# Patient Record
Sex: Female | Born: 1955 | Race: Black or African American | Hispanic: No | Marital: Single | Smoking: Never smoker
Health system: Southern US, Community
[De-identification: ages and names within clinical notes are randomized; demographics above are authoritative.]

## PROBLEM LIST (undated history)

## (undated) DIAGNOSIS — E119 Type 2 diabetes mellitus without complications: Secondary | ICD-10-CM

## (undated) DIAGNOSIS — M5430 Sciatica, unspecified side: Secondary | ICD-10-CM

## (undated) DIAGNOSIS — G4733 Obstructive sleep apnea (adult) (pediatric): Secondary | ICD-10-CM

## (undated) DIAGNOSIS — I10 Essential (primary) hypertension: Secondary | ICD-10-CM

## (undated) HISTORY — PX: COLPOSCOPY: SHX161

## (undated) HISTORY — PX: TONSILLECTOMY: SUR1361

## (undated) HISTORY — PX: LUMBAR LAMINECTOMY: SHX95

## (undated) HISTORY — PX: CHOLECYSTECTOMY: SHX55

## (undated) HISTORY — DX: Type 2 diabetes mellitus without complications: E11.9

## (undated) HISTORY — DX: Obstructive sleep apnea (adult) (pediatric): G47.33

## (undated) HISTORY — DX: Essential (primary) hypertension: I10

---

## 2012-05-09 ENCOUNTER — Encounter: Payer: Self-pay | Admitting: Women's Health

## 2012-05-09 ENCOUNTER — Ambulatory Visit (INDEPENDENT_AMBULATORY_CARE_PROVIDER_SITE_OTHER): Payer: Commercial Managed Care - PPO | Admitting: Women's Health

## 2012-05-09 VITALS — BP 144/92 | Ht 65.0 in | Wt 232.0 lb

## 2012-05-09 DIAGNOSIS — I10 Essential (primary) hypertension: Secondary | ICD-10-CM

## 2012-05-09 DIAGNOSIS — Z8632 Personal history of gestational diabetes: Secondary | ICD-10-CM

## 2012-05-09 DIAGNOSIS — Z780 Asymptomatic menopausal state: Secondary | ICD-10-CM

## 2012-05-09 DIAGNOSIS — Z01419 Encounter for gynecological examination (general) (routine) without abnormal findings: Secondary | ICD-10-CM

## 2012-05-09 HISTORY — DX: Personal history of gestational diabetes: Z86.32

## 2012-05-09 LAB — WET PREP FOR TRICH, YEAST, CLUE: Clue Cells Wet Prep HPF POC: NONE SEEN

## 2012-05-09 NOTE — Patient Instructions (Signed)

## 2012-05-09 NOTE — Progress Notes (Signed)
Cohen Doleman 1955-10-22 [id number]    History:    New patient presents for annual exam.  Postmenopausal with no bleeding on no HRT. Not sexually active for greater than 5 years. Hypertensive, primary care manages meds and labs. Normal colonoscopy at age 56. History of normal mammograms. History of ascus with negative colposcopy in 98, normal Paps after.  Past medical history, past surgical history, family history and social history were all reviewed and documented in the EPIC chart. 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 Four sons, attorney, M.D., CPA and a 73 year old. Originally from [place].   ROS:  A  ROS was performed and pertinent positives and negatives are included in the history.  Exam:  Filed Vitals:   05/09/12 1056  BP: 144/92    General appearance:  Normal Head/Neck:  Normal, without cervical or supraclavicular adenopathy. Thyroid:  Symmetrical, normal in size, without palpable masses or nodularity. Respiratory  Effort:  Normal  Auscultation:  Clear without wheezing or rhonchi Cardiovascular  Auscultation:  Regular rate, without rubs, murmurs or gallops  Edema/varicosities:  Not grossly evident Abdominal  Soft,nontender, without masses, guarding or rebound.  Liver/spleen:  No organomegaly noted  Hernia:  None appreciated  Skin  Inspection:  Grossly normal  Palpation:  Grossly normal Neurologic/psychiatric  Orientation:  Normal with appropriate conversation.  Mood/affect:  Normal  Genitourinary    Breasts: Examined lying and sitting.     Right: Without masses, retractions, discharge or axillary adenopathy.     Left: Without masses, retractions, discharge or axillary adenopathy.   Inguinal/mons:  Normal without inguinal adenopathy  External genitalia:  Normal minimal erythema external vaginal introitus  BUS/Urethra/Skene's glands:  Normal  Bladder:  Normal  Vagina:  Normal  Cervix:  Normal  Uterus:    normal in size, shape and contour.  Midline and mobile  Adnexa/parametria:     Rt: Without masses or tenderness.   Lt: Without masses or tenderness.  Anus and perineum: Normal  Digital rectal exam: Normal sphincter tone without palpated masses or tenderness  Assessment/Plan:  56 y.o. DBF G8P4 for annual exam with complaint of external vaginal itching.  Normal postmenopausal exam Overweight Hypertensive-primary care   Plan: DEXA, will schedule. Reviewed importance of vitamin D 2000 daily, increasing regular exercise, decreasing calories for weight loss and calcium rich diet encouraged. SBE's, continue annual mammogram. Wet prep negative, Terazol 7 cream externally as needed. Instructed to call if no relief of symptoms. UA, home Hemoccult card given with instructions, Pap normal 2012 per patient. Reviewed new screening guidelines. Instructed to have records sent.    Harrington Challenger WHNP, 1:35 PM 05/09/2012

## 2012-09-08 ENCOUNTER — Other Ambulatory Visit: Payer: Self-pay | Admitting: Gynecology

## 2012-09-08 DIAGNOSIS — Z780 Asymptomatic menopausal state: Secondary | ICD-10-CM

## 2013-06-06 DIAGNOSIS — I10 Essential (primary) hypertension: Secondary | ICD-10-CM | POA: Insufficient documentation

## 2013-06-06 DIAGNOSIS — N644 Mastodynia: Secondary | ICD-10-CM

## 2013-06-06 HISTORY — DX: Mastodynia: N64.4

## 2013-07-18 DIAGNOSIS — E7800 Pure hypercholesterolemia, unspecified: Secondary | ICD-10-CM

## 2013-07-18 DIAGNOSIS — G4700 Insomnia, unspecified: Secondary | ICD-10-CM | POA: Insufficient documentation

## 2013-07-18 DIAGNOSIS — R799 Abnormal finding of blood chemistry, unspecified: Secondary | ICD-10-CM | POA: Insufficient documentation

## 2013-07-18 HISTORY — DX: Abnormal finding of blood chemistry, unspecified: R79.9

## 2013-07-18 HISTORY — DX: Insomnia, unspecified: G47.00

## 2013-07-18 HISTORY — DX: Pure hypercholesterolemia, unspecified: E78.00

## 2013-12-22 DIAGNOSIS — R011 Cardiac murmur, unspecified: Secondary | ICD-10-CM

## 2013-12-22 HISTORY — DX: Cardiac murmur, unspecified: R01.1

## 2014-02-06 DIAGNOSIS — I517 Cardiomegaly: Secondary | ICD-10-CM | POA: Insufficient documentation

## 2014-02-06 DIAGNOSIS — I371 Nonrheumatic pulmonary valve insufficiency: Secondary | ICD-10-CM | POA: Insufficient documentation

## 2014-02-28 DIAGNOSIS — E876 Hypokalemia: Secondary | ICD-10-CM

## 2014-02-28 HISTORY — DX: Hypokalemia: E87.6

## 2014-04-09 ENCOUNTER — Encounter: Payer: Self-pay | Admitting: Women's Health

## 2014-07-10 DIAGNOSIS — K5909 Other constipation: Secondary | ICD-10-CM | POA: Insufficient documentation

## 2014-10-12 ENCOUNTER — Encounter (HOSPITAL_BASED_OUTPATIENT_CLINIC_OR_DEPARTMENT_OTHER): Payer: Commercial Managed Care - PPO

## 2014-12-30 ENCOUNTER — Ambulatory Visit (HOSPITAL_BASED_OUTPATIENT_CLINIC_OR_DEPARTMENT_OTHER): Payer: 59 | Attending: Internal Medicine

## 2015-01-14 DIAGNOSIS — Z1211 Encounter for screening for malignant neoplasm of colon: Secondary | ICD-10-CM | POA: Insufficient documentation

## 2015-01-14 DIAGNOSIS — N951 Menopausal and female climacteric states: Secondary | ICD-10-CM | POA: Insufficient documentation

## 2015-01-14 DIAGNOSIS — N6081 Other benign mammary dysplasias of right breast: Secondary | ICD-10-CM

## 2015-01-14 DIAGNOSIS — R102 Pelvic and perineal pain: Secondary | ICD-10-CM

## 2015-01-14 HISTORY — DX: Pelvic and perineal pain: R10.2

## 2015-01-14 HISTORY — DX: Encounter for screening for malignant neoplasm of colon: Z12.11

## 2015-01-14 HISTORY — DX: Other benign mammary dysplasias of right breast: N60.81

## 2015-06-04 DIAGNOSIS — J301 Allergic rhinitis due to pollen: Secondary | ICD-10-CM | POA: Insufficient documentation

## 2015-06-04 DIAGNOSIS — R09A2 Foreign body sensation, throat: Secondary | ICD-10-CM

## 2015-06-04 DIAGNOSIS — R110 Nausea: Secondary | ICD-10-CM

## 2015-06-04 DIAGNOSIS — J302 Other seasonal allergic rhinitis: Secondary | ICD-10-CM | POA: Insufficient documentation

## 2015-06-04 HISTORY — DX: Other seasonal allergic rhinitis: J30.2

## 2015-06-04 HISTORY — DX: Nausea: R11.0

## 2015-06-04 HISTORY — DX: Foreign body sensation, throat: R09.A2

## 2015-06-12 ENCOUNTER — Other Ambulatory Visit (HOSPITAL_COMMUNITY): Payer: Self-pay | Admitting: Internal Medicine

## 2015-06-12 DIAGNOSIS — I159 Secondary hypertension, unspecified: Secondary | ICD-10-CM

## 2015-06-13 ENCOUNTER — Ambulatory Visit (HOSPITAL_COMMUNITY)
Admission: RE | Admit: 2015-06-13 | Discharge: 2015-06-13 | Disposition: A | Discharge status: Home / Self Care | Payer: 59 | Source: Ambulatory Visit | Attending: Internal Medicine | Admitting: Internal Medicine

## 2015-06-13 DIAGNOSIS — I159 Secondary hypertension, unspecified: Secondary | ICD-10-CM | POA: Diagnosis not present

## 2015-06-13 NOTE — Progress Notes (Signed)
*  PRELIMINARY RESULTS* Vascular Ultrasound Renal artery duplex has been completed.  Preliminary findings: No evidence of renal artery stenosis.   Farrel DemarkJill Eunice, RDMS, RVT  06/13/2015, 3:05 PM

## 2015-06-24 MED FILL — AMLODIPINE BESYLATE 10 MG T: 10 | 90 days supply | Qty: 90 | Fill #1

## 2015-06-27 MED FILL — LOSARTAN-HCTZ 100-25 MG TAB: 100-25 | 90 days supply | Qty: 90 | Fill #1

## 2015-07-09 MED FILL — PANTOPRAZOLE SOD DR 40 MG T: 40 | 90 days supply | Qty: 90 | Fill #0

## 2015-07-12 DIAGNOSIS — I10 Essential (primary) hypertension: Secondary | ICD-10-CM | POA: Diagnosis not present

## 2015-07-12 DIAGNOSIS — I371 Nonrheumatic pulmonary valve insufficiency: Secondary | ICD-10-CM | POA: Diagnosis not present

## 2015-07-12 DIAGNOSIS — I517 Cardiomegaly: Secondary | ICD-10-CM | POA: Diagnosis not present

## 2015-07-12 DIAGNOSIS — E039 Hypothyroidism, unspecified: Secondary | ICD-10-CM | POA: Insufficient documentation

## 2015-07-12 MED FILL — cloNIDine HCL 0.1 MG TABS: 0.1 | 30 days supply | Qty: 60 | Fill #0

## 2015-07-12 MED FILL — METOPROLOL TARTRATE 100 MG: 100 | 90 days supply | Qty: 180 | Fill #0

## 2015-07-19 DIAGNOSIS — Z139 Encounter for screening, unspecified: Secondary | ICD-10-CM | POA: Diagnosis not present

## 2015-07-19 DIAGNOSIS — Z1231 Encounter for screening mammogram for malignant neoplasm of breast: Secondary | ICD-10-CM | POA: Diagnosis not present

## 2015-07-25 MED FILL — POTASSIUM CL ER 20 MEQ TABL: 20 | 30 days supply | Qty: 120 | Fill #0 | Status: TO

## 2015-07-31 DIAGNOSIS — R509 Fever, unspecified: Secondary | ICD-10-CM | POA: Diagnosis not present

## 2015-07-31 DIAGNOSIS — R6889 Other general symptoms and signs: Secondary | ICD-10-CM | POA: Diagnosis not present

## 2015-07-31 MED FILL — ONDANSETRON ODT 4 MG TABLET: 4 | 7 days supply | Qty: 20 | Fill #0

## 2015-07-31 MED FILL — OSELTAMIVIR PHOS 75 MG CAP: 75 | 5 days supply | Qty: 10 | Fill #0

## 2015-08-03 ENCOUNTER — Emergency Department (HOSPITAL_BASED_OUTPATIENT_CLINIC_OR_DEPARTMENT_OTHER)
Admission: EM | Admit: 2015-08-03 | Discharge: 2015-08-03 | Disposition: A | Discharge status: Home / Self Care | Payer: 59 | Attending: Emergency Medicine | Admitting: Emergency Medicine

## 2015-08-03 ENCOUNTER — Emergency Department (HOSPITAL_BASED_OUTPATIENT_CLINIC_OR_DEPARTMENT_OTHER): Payer: 59

## 2015-08-03 ENCOUNTER — Encounter (HOSPITAL_BASED_OUTPATIENT_CLINIC_OR_DEPARTMENT_OTHER): Payer: Self-pay

## 2015-08-03 DIAGNOSIS — J189 Pneumonia, unspecified organism: Secondary | ICD-10-CM | POA: Diagnosis not present

## 2015-08-03 DIAGNOSIS — B349 Viral infection, unspecified: Secondary | ICD-10-CM | POA: Insufficient documentation

## 2015-08-03 DIAGNOSIS — Z79899 Other long term (current) drug therapy: Secondary | ICD-10-CM | POA: Diagnosis not present

## 2015-08-03 DIAGNOSIS — Z7982 Long term (current) use of aspirin: Secondary | ICD-10-CM | POA: Diagnosis not present

## 2015-08-03 DIAGNOSIS — J159 Unspecified bacterial pneumonia: Secondary | ICD-10-CM | POA: Insufficient documentation

## 2015-08-03 DIAGNOSIS — Z8632 Personal history of gestational diabetes: Secondary | ICD-10-CM | POA: Diagnosis not present

## 2015-08-03 DIAGNOSIS — R112 Nausea with vomiting, unspecified: Secondary | ICD-10-CM | POA: Diagnosis present

## 2015-08-03 DIAGNOSIS — R509 Fever, unspecified: Secondary | ICD-10-CM | POA: Diagnosis not present

## 2015-08-03 LAB — CBC WITH DIFFERENTIAL/PLATELET
BASOS ABS: 0 10*3/uL (ref 0.0–0.1)
Basophils Relative: 0 %
EOS PCT: 0 %
Eosinophils Absolute: 0 10*3/uL (ref 0.0–0.7)
HEMATOCRIT: 38.7 % (ref 36.0–46.0)
Hemoglobin: 13.4 g/dL (ref 12.0–15.0)
LYMPHS ABS: 1.4 10*3/uL (ref 0.7–4.0)
LYMPHS PCT: 33 %
MCH: 31.8 pg (ref 26.0–34.0)
MCHC: 34.6 g/dL (ref 30.0–36.0)
MCV: 91.9 fL (ref 78.0–100.0)
MONO ABS: 0.3 10*3/uL (ref 0.1–1.0)
Monocytes Relative: 7 %
NEUTROS ABS: 2.6 10*3/uL (ref 1.7–7.7)
Neutrophils Relative %: 60 %
PLATELETS: 145 10*3/uL — AB (ref 150–400)
RBC: 4.21 MIL/uL (ref 3.87–5.11)
RDW: 12.9 % (ref 11.5–15.5)
WBC: 4.3 10*3/uL (ref 4.0–10.5)

## 2015-08-03 LAB — URINALYSIS, ROUTINE W REFLEX MICROSCOPIC
Bilirubin Urine: NEGATIVE
GLUCOSE, UA: NEGATIVE mg/dL
HGB URINE DIPSTICK: NEGATIVE
Ketones, ur: NEGATIVE mg/dL
LEUKOCYTES UA: NEGATIVE
Nitrite: NEGATIVE
PH: 6.5 (ref 5.0–8.0)
Protein, ur: NEGATIVE mg/dL
Specific Gravity, Urine: 1.006 (ref 1.005–1.030)

## 2015-08-03 LAB — COMPREHENSIVE METABOLIC PANEL
ALBUMIN: 3.9 g/dL (ref 3.5–5.0)
ALT: 28 U/L (ref 14–54)
ANION GAP: 11 (ref 5–15)
AST: 44 U/L — AB (ref 15–41)
Alkaline Phosphatase: 44 U/L (ref 38–126)
BUN: 10 mg/dL (ref 6–20)
CHLORIDE: 98 mmol/L — AB (ref 101–111)
CO2: 29 mmol/L (ref 22–32)
Calcium: 8.8 mg/dL — ABNORMAL LOW (ref 8.9–10.3)
Creatinine, Ser: 0.91 mg/dL (ref 0.44–1.00)
GFR calc Af Amer: >60 mL/min (ref >60)
GFR calc non Af Amer: >60 mL/min (ref >60)
GLUCOSE: 149 mg/dL — AB (ref 65–99)
POTASSIUM: 3.3 mmol/L — AB (ref 3.5–5.1)
Sodium: 138 mmol/L (ref 135–145)
TOTAL PROTEIN: 7.9 g/dL (ref 6.5–8.1)
Total Bilirubin: 1 mg/dL (ref 0.3–1.2)

## 2015-08-03 MED ORDER — SODIUM CHLORIDE 0.9 % IV BOLUS (SEPSIS)
1000.0000 mL | Freq: Once | INTRAVENOUS | Status: AC
  Administered 2015-08-03: 1000 mL via INTRAVENOUS

## 2015-08-03 MED ORDER — PROMETHAZINE HCL 25 MG PO TABS
25.0000 mg | ORAL_TABLET | Freq: Four times a day (QID) | ORAL | Status: DC | PRN
Start: 2015-08-03 — End: 2021-10-10

## 2015-08-03 MED ORDER — ONDANSETRON HCL 4 MG/2ML IJ SOLN
4.0000 mg | Freq: Once | INTRAMUSCULAR | Status: AC
  Administered 2015-08-03: 4 mg via INTRAVENOUS
  Filled 2015-08-03: qty 2

## 2015-08-03 MED ORDER — AZITHROMYCIN 250 MG PO TABS: 250.0000 mg | ORAL_TABLET | Freq: Every day | ORAL | Status: DC

## 2015-08-03 NOTE — ED Notes (Signed)
MD at bedside discussing results with patient 

## 2015-08-03 NOTE — ED Notes (Signed)
Pt attempting to obtain urine at this time  

## 2015-08-03 NOTE — ED Notes (Signed)
Pt changing into gown at this time

## 2015-08-03 NOTE — ED Notes (Signed)
Pt reports nausea and fever since Sunday. Reports going to [facility], treated with tamiflu and zofran. Reports no relief with zofran. Denies any pain. Reports generalized fatigue.

## 2015-08-03 NOTE — Discharge Instructions (Signed)

## 2015-08-03 NOTE — ED Notes (Signed)
Pt transported to XR at this time.

## 2015-08-03 NOTE — ED Provider Notes (Signed)
CSN: [id number]     Arrival date & time 08/03/15  4098 History   First MD Initiated Contact with Patient 08/03/15 [phone]     Chief Complaint  Patient presents with  . Emesis     (Consider location/radiation/quality/duration/timing/severity/associated sxs/prior Treatment) HPI Comments: Patient presents with nausea and vomiting. She started feeling sick about 6 days ago. She noted some nasal congestion and postnasal drip. The next day she was feeling achy and feverish. Her temperature gradually rose 202. She was having myalgias and a productive cough. She was seen at an urgent care center. This was 4 days ago. She had a negative rapid influenza test but was still diagnosed with probable influenza. She's had multiple coworkers.has had the flu. She was started on Tamiflu and Zofran. She had some mild nausea prior to this visit. She states that since then she's had worsening nausea and several episodes of vomiting. She's having normal bowel movements. She states her fevers have improved. Her myalgias and congestion have improved although she still has a productive cough.  She denies any urinary symptoms. She denies abdominal pain.  No CP or SOB.  She is postmenopausal.  Patient is a 60 y.o. female presenting with vomiting.  Emesis Associated symptoms: myalgias   Associated symptoms: no abdominal pain, no arthralgias, no chills, no diarrhea and no headaches     Past Medical History  Diagnosis Date  . Diabetes mellitus without complication [facility])     gestational   Past Surgical History  Procedure Laterality Date  . Colposcopy    . Cesarean section      x3  . Cholecystectomy    . Tonsillectomy     Family History  Problem Relation Age of Onset  . Hypertension Mother   . Stroke Mother   . Thyroid disease Mother    Social History  Substance Use Topics  . Smoking status: Never Smoker   . Smokeless tobacco: None  . Alcohol Use: No   OB History    Gravida Para Term Preterm AB TAB SAB  Ectopic Multiple Living   Review of Systems  Constitutional: Positive for fatigue. Negative for fever, chills and diaphoresis.  HENT: Positive for congestion, postnasal drip and rhinorrhea. Negative for sneezing.   Eyes: Negative.   Respiratory: Positive for cough. Negative for chest tightness and shortness of breath.   Cardiovascular: Negative for chest pain and leg swelling.  Gastrointestinal: Positive for nausea and vomiting. Negative for abdominal pain, diarrhea and blood in stool.  Genitourinary: Negative for frequency, hematuria, flank pain and difficulty urinating.  Musculoskeletal: Positive for myalgias. Negative for back pain and arthralgias.  Skin: Negative for rash.  Neurological: Negative for dizziness, speech difficulty, weakness, numbness and headaches.      Allergies  Review of patient's allergies indicates no known allergies.  Home Medications   Prior to Admission medications   Medication Sig Start Date End Date Taking? Authorizing Provider  aspirin 81 MG tablet Take 81 mg by mouth daily.    Historical Provider, MD  azithromycin (ZITHROMAX) 250 MG tablet Take 1 tablet (250 mg total) by mouth daily. Take first 2 tablets together, then 1 every day until finished. 08/03/15   Rolan Bucco, MD  metoprolol succinate (TOPROL-XL) 100 MG 24 hr tablet Take 100 mg by mouth daily. Take with or immediately following a meal.    Historical Provider, MD  Multiple Vitamin (MULTIVITAMIN) capsule Take 1 capsule by mouth  daily.    Historical Provider, MD  Olmesartan-Amlodipine-HCTZ (TRIBENZOR) 40-10-25 MG TABS Take by mouth.    Historical Provider, MD  pantoprazole (PROTONIX) 40 MG tablet Take 40 mg by mouth daily.    Historical Provider, MD  promethazine (PHENERGAN) 25 MG tablet Take 1 tablet (25 mg total) by mouth every 6 (six) hours as needed for nausea or vomiting. 08/03/15   Rolan Bucco, MD   BP 142/73 mmHg  Pulse 62  Temp(Src) 99.4 F (37.4 C) (Oral)   Resp 20  Ht  (1.626 m)  Wt 224 lb (101.606 kg)  BMI 38.43 kg/m2  SpO2 95% Physical Exam  Constitutional: She is oriented to person, place, and time. She appears well-developed and well-nourished.  HENT:  Head: Normocephalic and atraumatic.  Right Ear: External ear normal.  Left Ear: External ear normal.  Mouth/Throat: Oropharynx is clear and moist.  Eyes: Pupils are equal, round, and reactive to light.  Neck: Normal range of motion. Neck supple.  Cardiovascular: Normal rate, regular rhythm and normal heart sounds.   Pulmonary/Chest: Effort normal and breath sounds normal. No respiratory distress. She has no wheezes. She has no rales. She exhibits no tenderness.  Abdominal: Soft. Bowel sounds are normal. There is no tenderness. There is no rebound and no guarding.  Musculoskeletal: Normal range of motion. She exhibits no edema.  Lymphadenopathy:    She has no cervical adenopathy.  Neurological: She is alert and oriented to person, place, and time.  Skin: Skin is warm and dry. No rash noted.  Psychiatric: She has a normal mood and affect.    ED Course  Procedures (including critical care time) Labs Review Labs Reviewed  COMPREHENSIVE METABOLIC PANEL - Abnormal; Notable for the following:    Potassium 3.3 (*)    Chloride 98 (*)    Glucose, Bld 149 (*)    Calcium 8.8 (*)    AST 44 (*)    All other components within normal limits  CBC WITH DIFFERENTIAL/PLATELET - Abnormal; Notable for the following:    Platelets 145 (*)    All other components within normal limits  URINALYSIS, ROUTINE W REFLEX MICROSCOPIC (NOT AT [facility])    Imaging Review Dg Chest 2 View  08/03/2015  CLINICAL DATA:  Shortness breath and fever for 2 days. EXAM: CHEST  2 VIEW COMPARISON:  None. FINDINGS: The cardiomediastinal silhouette is unremarkable. Ill-defined patchy nodular opacities within the right mid lung and left mid-lower lung are identified and suspect pneumonia. There is no evidence of pleural  effusion, pneumothorax or pulmonary edema. Mild peribronchial thickening is identified. No acute bony abnormalities are noted. IMPRESSION: Ill-defined patchy nodular opacities within the right mid and left mid -lower lungs - suspect pneumonia. Radiographic follow-up to resolution. Electronically Signed   By: Harmon Pier M.D.   On: 08/03/2015 09:11   I have personally reviewed and evaluated these images and lab results as part of my medical decision-making.   EKG Interpretation None      MDM   Final diagnoses:  Viral syndrome  CAP (community acquired pneumonia)    Patient presents with a viral-like symptoms, likely consistent with influenza. She has ongoing nausea and vomiting which is likely partly related to the influenza and could be made worse by the Tamiflu. At this point, I advised her to stop the Tamiflu. She was hydrated with normal saline. She was given IV Zofran and is feeling much better after this. She's tolerating by mouth fluids. Her chest x-ray does show probably pneumonia.  I will start her on Zithromax. I feel that outpatient treatment is appropriate. Her vital signs are stable. There is no hypoxia. She has no shortness of breath. She was given a prescription for Phenergan to use as needed for ongoing nausea. I encouraged her to follow-up with her PCP in 2 days if her symptoms are not improving or return here as needed for any worsening symptoms. I also advised her that her blood glucose was slightly elevated and this needs to be followed up on by her PCP.    Rolan Bucco, MD 08/03/15 [phone]

## 2015-08-03 NOTE — ED Notes (Signed)
MD at bedside. 

## 2015-08-03 NOTE — ED Notes (Signed)
Pt given PO fluids at this time 

## 2015-08-06 MED FILL — AMOX-CLAV 875-125 MG TABLET: 875-125 | 10 days supply | Qty: 20 | Fill #0

## 2015-08-09 DIAGNOSIS — I10 Essential (primary) hypertension: Secondary | ICD-10-CM | POA: Diagnosis not present

## 2015-08-09 MED FILL — cloNIDine HCL 0.1 MG TABS: 0.1 | 45 days supply | Qty: 180 | Fill #0 | Status: TO

## 2015-09-04 MED FILL — ZOLPIDEM TARTRATE 10 MG TAB: 10 | 90 days supply | Qty: 90 | Fill #1

## 2015-09-06 DIAGNOSIS — Z01411 Encounter for gynecological examination (general) (routine) with abnormal findings: Secondary | ICD-10-CM | POA: Diagnosis not present

## 2015-09-06 DIAGNOSIS — N952 Postmenopausal atrophic vaginitis: Secondary | ICD-10-CM | POA: Diagnosis not present

## 2015-09-06 DIAGNOSIS — Z01419 Encounter for gynecological examination (general) (routine) without abnormal findings: Secondary | ICD-10-CM | POA: Diagnosis not present

## 2015-09-06 DIAGNOSIS — N762 Acute vulvitis: Secondary | ICD-10-CM | POA: Diagnosis not present

## 2015-09-06 DIAGNOSIS — Z6836 Body mass index (BMI) 36.0-36.9, adult: Secondary | ICD-10-CM | POA: Diagnosis not present

## 2015-09-06 MED FILL — FLUCONAZOLE 150 MG TABLET: 150 | 6 days supply | Qty: 3 | Fill #0 | Status: TO

## 2015-09-06 MED FILL — AMLODIPINE BESYLATE 10 MG T: 10 | 90 days supply | Qty: 90 | Fill #0 | Status: TO

## 2015-09-11 DIAGNOSIS — I10 Essential (primary) hypertension: Secondary | ICD-10-CM | POA: Diagnosis not present

## 2015-09-16 DIAGNOSIS — R0683 Snoring: Secondary | ICD-10-CM | POA: Diagnosis not present

## 2015-09-16 DIAGNOSIS — I10 Essential (primary) hypertension: Secondary | ICD-10-CM | POA: Diagnosis not present

## 2015-09-25 MED FILL — KLOR-CON M20 TABLET: 20 | 30 days supply | Qty: 120 | Fill #0 | Status: TO

## 2015-09-25 MED FILL — cloNIDine HCL 0.1 MG TABS: 0.1 | 45 days supply | Qty: 180 | Fill #0

## 2015-10-09 ENCOUNTER — Ambulatory Visit (HOSPITAL_BASED_OUTPATIENT_CLINIC_OR_DEPARTMENT_OTHER): Payer: 59 | Attending: Internal Medicine | Admitting: Internal Medicine

## 2015-10-09 VITALS — Ht 65.0 in | Wt 230.0 lb

## 2015-10-09 DIAGNOSIS — G4736 Sleep related hypoventilation in conditions classified elsewhere: Secondary | ICD-10-CM | POA: Insufficient documentation

## 2015-10-09 DIAGNOSIS — G4733 Obstructive sleep apnea (adult) (pediatric): Secondary | ICD-10-CM | POA: Insufficient documentation

## 2015-10-09 DIAGNOSIS — R0683 Snoring: Secondary | ICD-10-CM | POA: Insufficient documentation

## 2015-10-09 MED FILL — PANTOPRAZOLE SOD DR 40 MG T: 40 | 90 days supply | Qty: 90 | Fill #1

## 2015-10-15 MED FILL — LOSARTAN-HCTZ 100-25 MG TAB: 100-25 | 90 days supply | Qty: 90 | Fill #0

## 2015-10-19 DIAGNOSIS — G4733 Obstructive sleep apnea (adult) (pediatric): Secondary | ICD-10-CM

## 2015-10-19 DIAGNOSIS — R0683 Snoring: Secondary | ICD-10-CM | POA: Diagnosis not present

## 2015-10-19 NOTE — Procedures (Signed)
        Patient Name: Janet Phelps, Janet Phelps Study Date: 10/09/2015 Gender: Female D.O.B: 08/28/1955 Age (years): 59 Referring Provider: Agustina CaroliKristin D Hicks Height (inches): 65 Interpreting Physician: Jetty Duhamellinton Young MD, ABSM Weight (lbs): 223 RPSGT: [place], [place] BMI: 37 MRN: [id number] Neck Size: 14.00 CLINICAL INFORMATION Sleep Study Type: HST Indication for sleep study: N/A Epworth Sleepiness Score: 2  SLEEP STUDY TECHNIQUE A multi-channel overnight portable sleep study was performed. The channels recorded were: nasal airflow, thoracic respiratory movement, and oxygen saturation with a pulse oximetry. Snoring was also monitored.  MEDICATIONS Patient self administered medications include: none reported during sleep study  SLEEP ARCHITECTURE Patient was studied for 460.1 minutes. The sleep efficiency was 97.1 % and the patient was supine for 50.9%. The arousal index was 0.0 per hour.  RESPIRATORY PARAMETERS The overall AHI was 12.5 per hour, with a central apnea index of 0.0 per hour. The oxygen nadir was 80% during sleep.  CARDIAC DATA Mean heart rate during sleep was 52.1 bpm.  IMPRESSIONS - Mild obstructive sleep apnea occurred during this study (AHI = 12.5/h). - No significant central sleep apnea occurred during this study (CAI = 0.0/h). - Severe oxygen desaturation was noted during this study (Min O2 = 80%). - Patient snored .  DIAGNOSIS - Obstructive Sleep Apnea (327.23 [G47.33 ICD-10]) - Nocturnal Hypoxemia (327.26 [G47.36 ICD-10])  RECOMMENDATIONS - Therapeutic CPAP titration to determine optimal pressure required to alleviate sleep disordered breathing. - Positional therapy avoiding supine position during sleep. - Surgical consultation for Uvulopalatopharyngoplasty (UPPP) may be considered. - Oral appliance may be considered. - Avoid alcohol, sedatives and other CNS depressants that may worsen sleep apnea and disrupt normal sleep architecture. - Sleep  hygiene should be reviewed to assess factors that may improve sleep quality. - Weight management and regular exercise should be initiated or continued.  Waymon BudgeYOUNG,CLINTON D Diplomate, American Board of Sleep Medicine  ELECTRONICALLY SIGNED ON:  10/19/2015, 11:29 AM [facility] PH: [phone]   FX: [phone] ACCREDITED BY THE AMERICAN ACADEMY OF SLEEP MEDICINE

## 2015-11-06 MED FILL — METOPROLOL TARTRATE 100 MG: 100 | 90 days supply | Qty: 180 | Fill #1 | Status: TO

## 2015-11-06 MED FILL — POTASSIUM CL ER 20 MEQ TABL: 20 | 30 days supply | Qty: 120 | Fill #0 | Status: TO

## 2015-12-04 DIAGNOSIS — K219 Gastro-esophageal reflux disease without esophagitis: Secondary | ICD-10-CM | POA: Diagnosis not present

## 2015-12-04 DIAGNOSIS — I10 Essential (primary) hypertension: Secondary | ICD-10-CM | POA: Diagnosis not present

## 2015-12-04 MED FILL — cloNIDine HCL 0.1 MG TABS: 0.1 | 30 days supply | Qty: 90 | Fill #0 | Status: TO

## 2016-08-31 DIAGNOSIS — Z1231 Encounter for screening mammogram for malignant neoplasm of breast: Secondary | ICD-10-CM | POA: Diagnosis not present

## 2016-12-23 DIAGNOSIS — J343 Hypertrophy of nasal turbinates: Secondary | ICD-10-CM | POA: Insufficient documentation

## 2016-12-23 DIAGNOSIS — R0683 Snoring: Secondary | ICD-10-CM

## 2016-12-23 HISTORY — DX: Snoring: R06.83

## 2017-01-04 DIAGNOSIS — R109 Unspecified abdominal pain: Secondary | ICD-10-CM | POA: Insufficient documentation

## 2017-01-04 DIAGNOSIS — G8929 Other chronic pain: Secondary | ICD-10-CM

## 2017-01-04 DIAGNOSIS — R8290 Unspecified abnormal findings in urine: Secondary | ICD-10-CM

## 2017-01-04 DIAGNOSIS — M549 Dorsalgia, unspecified: Secondary | ICD-10-CM | POA: Insufficient documentation

## 2017-01-04 HISTORY — DX: Unspecified abdominal pain: R10.9

## 2017-01-04 HISTORY — DX: Unspecified abnormal findings in urine: R82.90

## 2017-01-04 HISTORY — DX: Dorsalgia, unspecified: M54.9

## 2017-01-04 HISTORY — DX: Other chronic pain: G89.29

## 2017-06-25 DIAGNOSIS — R7301 Impaired fasting glucose: Secondary | ICD-10-CM | POA: Insufficient documentation

## 2017-06-25 DIAGNOSIS — Z8619 Personal history of other infectious and parasitic diseases: Secondary | ICD-10-CM

## 2017-06-25 HISTORY — DX: Morbid (severe) obesity due to excess calories: E66.01

## 2017-06-25 HISTORY — DX: Personal history of other infectious and parasitic diseases: Z86.19

## 2017-07-23 DIAGNOSIS — E559 Vitamin D deficiency, unspecified: Secondary | ICD-10-CM

## 2017-07-23 HISTORY — DX: Vitamin D deficiency, unspecified: E55.9

## 2018-05-09 DIAGNOSIS — D649 Anemia, unspecified: Secondary | ICD-10-CM

## 2018-05-09 HISTORY — DX: Anemia, unspecified: D64.9

## 2019-01-19 DIAGNOSIS — G4733 Obstructive sleep apnea (adult) (pediatric): Secondary | ICD-10-CM | POA: Insufficient documentation

## 2021-09-25 ENCOUNTER — Ambulatory Visit: Payer: 59 | Admitting: Family

## 2021-09-25 DIAGNOSIS — Z1231 Encounter for screening mammogram for malignant neoplasm of breast: Secondary | ICD-10-CM | POA: Diagnosis not present

## 2021-09-25 DIAGNOSIS — Z01419 Encounter for gynecological examination (general) (routine) without abnormal findings: Secondary | ICD-10-CM | POA: Diagnosis not present

## 2021-09-25 DIAGNOSIS — Z1329 Encounter for screening for other suspected endocrine disorder: Secondary | ICD-10-CM | POA: Diagnosis not present

## 2021-09-25 DIAGNOSIS — Z0001 Encounter for general adult medical examination with abnormal findings: Secondary | ICD-10-CM | POA: Diagnosis not present

## 2021-09-25 DIAGNOSIS — Z136 Encounter for screening for cardiovascular disorders: Secondary | ICD-10-CM | POA: Diagnosis not present

## 2021-09-25 DIAGNOSIS — Z124 Encounter for screening for malignant neoplasm of cervix: Secondary | ICD-10-CM | POA: Diagnosis not present

## 2021-09-25 DIAGNOSIS — Z131 Encounter for screening for diabetes mellitus: Secondary | ICD-10-CM | POA: Diagnosis not present

## 2021-09-25 DIAGNOSIS — M545 Low back pain, unspecified: Secondary | ICD-10-CM | POA: Diagnosis not present

## 2021-09-25 DIAGNOSIS — N898 Other specified noninflammatory disorders of vagina: Secondary | ICD-10-CM | POA: Diagnosis not present

## 2021-09-25 DIAGNOSIS — R011 Cardiac murmur, unspecified: Secondary | ICD-10-CM | POA: Diagnosis not present

## 2021-10-09 ENCOUNTER — Other Ambulatory Visit (HOSPITAL_BASED_OUTPATIENT_CLINIC_OR_DEPARTMENT_OTHER): Payer: Self-pay

## 2021-10-09 DIAGNOSIS — R7303 Prediabetes: Secondary | ICD-10-CM | POA: Diagnosis not present

## 2021-10-09 DIAGNOSIS — E039 Hypothyroidism, unspecified: Secondary | ICD-10-CM | POA: Diagnosis not present

## 2021-10-09 DIAGNOSIS — E782 Mixed hyperlipidemia: Secondary | ICD-10-CM | POA: Diagnosis not present

## 2021-10-09 DIAGNOSIS — M546 Pain in thoracic spine: Secondary | ICD-10-CM | POA: Diagnosis not present

## 2021-10-09 DIAGNOSIS — I10 Essential (primary) hypertension: Secondary | ICD-10-CM | POA: Diagnosis not present

## 2021-10-09 DIAGNOSIS — K219 Gastro-esophageal reflux disease without esophagitis: Secondary | ICD-10-CM | POA: Diagnosis not present

## 2021-10-09 DIAGNOSIS — M479 Spondylosis, unspecified: Secondary | ICD-10-CM | POA: Diagnosis not present

## 2021-10-09 MED ORDER — AMLODIPINE BESYLATE 2.5 MG PO TABS
ORAL_TABLET | ORAL | 1 refills | Status: DC
  Filled 2021-10-09: qty 30, 30d supply, fill #0
  Filled 2022-02-07: qty 30, 30d supply, fill #1

## 2021-10-10 ENCOUNTER — Encounter: Payer: Self-pay | Admitting: Cardiology

## 2021-10-10 ENCOUNTER — Other Ambulatory Visit (HOSPITAL_BASED_OUTPATIENT_CLINIC_OR_DEPARTMENT_OTHER): Payer: Self-pay

## 2021-10-10 ENCOUNTER — Ambulatory Visit: Payer: 59 | Admitting: Cardiology

## 2021-10-10 VITALS — BP 106/67 | HR 59 | Temp 97.1°F | Resp 14 | Ht 65.0 in | Wt 232.4 lb

## 2021-10-10 DIAGNOSIS — Z9989 Dependence on other enabling machines and devices: Secondary | ICD-10-CM | POA: Diagnosis not present

## 2021-10-10 DIAGNOSIS — Z6838 Body mass index (BMI) 38.0-38.9, adult: Secondary | ICD-10-CM | POA: Diagnosis not present

## 2021-10-10 DIAGNOSIS — R0609 Other forms of dyspnea: Secondary | ICD-10-CM | POA: Diagnosis not present

## 2021-10-10 DIAGNOSIS — I10 Essential (primary) hypertension: Secondary | ICD-10-CM | POA: Diagnosis not present

## 2021-10-10 DIAGNOSIS — E6609 Other obesity due to excess calories: Secondary | ICD-10-CM

## 2021-10-10 DIAGNOSIS — G4733 Obstructive sleep apnea (adult) (pediatric): Secondary | ICD-10-CM | POA: Diagnosis not present

## 2021-10-10 MED ORDER — NEBIVOLOL HCL 20 MG PO TABS
1.0000 | ORAL_TABLET | Freq: Every day | ORAL | 2 refills | Status: DC
  Filled 2021-10-10: qty 30, 30d supply, fill #0
  Filled 2021-11-12: qty 60, 60d supply, fill #1

## 2021-10-10 NOTE — Progress Notes (Signed)
? ?Primary Physician/Referring:  Karleen Hampshire., MD ? ?Patient ID: Janet Phelps, female    DOB: 1955/11/17, 66 y.o.   MRN: [id number] ? ?Chief Complaint  ?Patient presents with  ? New Patient (Initial Visit)  ? Chest Pain  ? Heart Murmur  ? ?HPI:   ? ?Janet Phelps  is a 66 y.o. [place] female patient referred to me for evaluation of previously known heart murmur and also chest pains and hypertension management. ? ?Patient has history of hypertension, mild hyperlipidemia, obstructive sleep apnea on CPAP but not recently compliant on a daily basis, obesity.  She is essentially asymptomatic and very mild dyspnea on exertion related to deconditioning and also obesity.  Her physical examination is completely normal except for moderate obesity.  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  Patient does admit to poor eating habits. ? ?Recently she had noticed leg edema, however this is resolved since decreasing the dose of amlodipine from 10 mg to 2.5 mg daily. ? ?Past Medical History:  ?Diagnosis Date  ? Diabetes mellitus without complication ([place])   ? gestational  ? ?Past Surgical History:  ?Procedure Laterality Date  ? CESAREAN SECTION    ? x3  ? CHOLECYSTECTOMY    ? COLPOSCOPY    ? TONSILLECTOMY    ? ?Family History  ?Problem Relation Age of Onset  ? Hypertension Mother   ? Stroke Mother   ? Thyroid disease Mother   ? Hypertension Sister   ? Hypertension Sister   ? Hypertension Sister   ?  ?Social History  ? ?Tobacco Use  ? Smoking status: Never  ? Smokeless tobacco: Never  ?Substance Use Topics  ? Alcohol use: Yes  ?  Comment: OCC  ? ?Marital Status: Single  ?ROS  ?Review of Systems  ?Cardiovascular:  Positive for dyspnea on exertion. Negative for chest pain and leg swelling.  ?Objective  ?Blood pressure 106/67, pulse (!) 59, temperature (!) 97.1 ?F (36.2 ?C), temperature source Temporal, resp. rate 14, height 5' 5"  (1.651 m), weight 232 lb 6.4  oz (105.4 kg), SpO2 98 %. Body mass index is 38.67 kg/m?.  ? ?  10/10/2021  ? 10:49 AM 10/09/2015  ?  1:23 PM 08/03/2015  ? 10:37 AM  ?Vitals with BMI  ?Height 5' 5"  5' 5"    ?Weight 232 lbs 6 oz 230 lbs   ?BMI 38.67 38.4   ?Systolic 263  335  ?Diastolic 67  77  ?Pulse 59  60  ?  ?Physical Exam ?Constitutional:   ?   Appearance: She is obese.  ?Neck:  ?   Vascular: No JVD.  ?Cardiovascular:  ?   Rate and Rhythm: Normal rate and regular rhythm.  ?   Pulses: Intact distal pulses.  ?   Heart sounds: Normal heart sounds. No murmur heard. ?  No gallop.  ?Pulmonary:  ?   Effort: Pulmonary effort is normal.  ?   Breath sounds: Normal breath sounds.  ?Abdominal:  ?   General: Bowel sounds are normal.  ?   Palpations: Abdomen is soft.  ?Musculoskeletal:  ?   Right lower leg: No edema.  ?   Left lower leg: No edema.  ? ? ?Medications and allergies  ? ?Allergies  ?Allergen Reactions  ? Labetalol Itching  ?  Scalp itching, hard to breathe, feel stuffy ?Scalp itching, hard to breathe, feel stuffy ?  ?  ? ?Medication list after today's encounter  ? ?Current  Outpatient Medications:  ?  amLODipine (NORVASC) 2.5 MG tablet, Take 1 tablet by mouth once daily, Disp: 30 tablet, Rfl: 1 ?  Ascorbic Acid (VITAMIN C PO), Take by mouth., Disp: , Rfl:  ?  aspirin 81 MG tablet, Take 81 mg by mouth 2 (two) times a week., Disp: , Rfl:  ?  atovaquone-proguanil (MALARONE) 250-100 MG TABS tablet, Take 1 tablet by mouth as needed., Disp: , Rfl:  ?  B Complex Vitamins (VITAMIN-B COMPLEX PO), Take by mouth., Disp: , Rfl:  ?  ciprofloxacin (CIPRO) 500 MG tablet, Take 500 mg by mouth as needed., Disp: , Rfl:  ?  lansoprazole (PREVACID) 30 MG capsule, Take 30 mg by mouth daily., Disp: , Rfl:  ?  levothyroxine (SYNTHROID) 50 MCG tablet, Take 50 mcg by mouth daily., Disp: , Rfl:  ?  lisinopril-hydrochlorothiazide (ZESTORETIC) 20-25 MG tablet, Take 1 tablet by mouth daily., Disp: , Rfl:  ?  loratadine (CLARITIN) 10 MG tablet, Take 1 tablet by mouth as needed.,  Disp: , Rfl:  ?  Magnesium 400 MG TABS, Take by mouth., Disp: , Rfl:  ?  Multiple Vitamin (MULTIVITAMIN) capsule, Take 1 capsule by mouth daily., Disp: , Rfl:  ?  Nebivolol HCl 20 MG TABS, Take 1 tablet (20 mg total) by mouth daily., Disp: 30 tablet, Rfl: 2 ?  potassium chloride SA (KLOR-CON M) 20 MEQ tablet, Take 1 tablet by mouth in the morning and at bedtime., Disp: , Rfl:  ?  spironolactone (ALDACTONE) 25 MG tablet, Take 25 mg by mouth daily., Disp: , Rfl:  ? ?Laboratory examination:  ? ?External labs:  ? ?Labs 09/25/2021: ? ?Total cholesterol 190, triglycerides 181, HDL 50, LDL 109.  Non-HDL cholesterol 140. ? ?Hb 11.8, HCT 36.3, platelets 214, normal indicis. ? ?Serum glucose 95 mg, BUN 17, creatinine 1.01, EGFR 62 mL, potassium 4.4, LFTs normal. ? ?Radiology:  ? ? ?Cardiac Studies:  ? ?Sleep study 08/01/2017: ?PSG sleep study run in BR #1 by  Rollene Fare, RPSGT.  Pt exhibited moderate overall AHI with worse obstrictions noted supine.  SPO2 Nadir to 69%.  Loud snorng to + 4.  Few PLMS.  Supine and lateral sleep.  NSR throughout.  Pt did not meet split night cdriteria and was not placed on nasal CPAP. ? ?Lexiscan nuclear stress test 08/31/2017: ?No evidence of ischemia or scar.  Normal LVEF. ? ?Echocardiogram 08/31/2017: ?Normal LV size, borderline LVH, LVEF 55%.  Mild MR and TR.  No evidence of pulm hypertension. ? ?EKG:  ? ?EKG 10/10/2021: Sinus bradycardia with sinus arrhythmia at rate of 47 bpm, normal axis, no evidence of ischemia.  Normal QT interval.   ? ?Assessment  ? ?  ICD-10-CM   ?1. Dyspnea on exertion  R06.09   ?  ?2. Primary hypertension  I10 EKG 12-Lead  ?  Nebivolol HCl 20 MG TABS  ?  ?3. OSA on CPAP  G47.33   ? Z99.89   ?  ?4. Class 2 obesity due to excess calories without serious comorbidity with body mass index (BMI) of 38.0 to 38.9 in adult  E66.09   ? N86.76   ?  ?  ? ?Medications Discontinued During This Encounter  ?Medication Reason  ? azithromycin (ZITHROMAX) 250 MG tablet   ? metoprolol  succinate (TOPROL-XL) 100 MG 24 hr tablet Change in therapy  ? Olmesartan-amLODIPine-HCTZ 40-10-25 MG TABS Patient has not taken in last 30 days  ? pantoprazole (PROTONIX) 40 MG tablet No longer needed (for PRN medications)  ?  promethazine (PHENERGAN) 25 MG tablet No longer needed (for PRN medications)  ?  ?Meds ordered this encounter  ?Medications  ? Nebivolol HCl 20 MG TABS  ?  Sig: Take 1 tablet (20 mg total) by mouth daily.  ?  Dispense:  30 tablet  ?  Refill:  2  ?  Discontinue Metoprolol  ? ?Orders Placed This Encounter  ?Procedures  ? EKG 12-Lead  ? ?Recommendations:  ? ?Kynsleigh Westendorf is a 66 y.o. [place] female patient referred to me for evaluation of previously known heart murmur and also chest pains and hypertension management. ? ?Patient has history of hypertension, mild hyperlipidemia, obstructive sleep apnea on CPAP but not recently compliant on a daily basis, obesity.  She is essentially asymptomatic and very mild dyspnea on exertion related to deconditioning and also obesity.  Her physical examination is completely normal except for moderate obesity.  EKG is also normal except for marked bradycardia.  I suspect mild bradycardia with metoprolol could be leading to mild dyspnea on exertion and reduced exercise tolerance as well as difficulty in weight loss.  Patient does admit to poor eating habits. ? ?I will discontinue metoprolol succinate and switch her to Bystolic 20 mg daily.  She can slowly wean off the metoprolol succinate over the next 1 week.  If blood pressure is well controlled like it is today, she could certainly come off of amlodipine as well. ? ?Otherwise she is on appropriate medical therapy, no changes were needed.  Weight loss extensively discussed with the patient.  Compliance with CPAP also discussed with the patient. ? ?I reviewed her nuclear stress test and also echocardiogram that was performed recently in the last <3 years.  She has minimal mitral regurgitation,  tricuspid vegetation of no clinical consequence, heart physical examination does not reveal any heart murmur.  Her symptoms of chest pain that she describes as fluttering/sharp chest pains/tingling in the chest

## 2021-10-13 ENCOUNTER — Other Ambulatory Visit (HOSPITAL_BASED_OUTPATIENT_CLINIC_OR_DEPARTMENT_OTHER): Payer: Self-pay

## 2021-10-14 ENCOUNTER — Other Ambulatory Visit (HOSPITAL_BASED_OUTPATIENT_CLINIC_OR_DEPARTMENT_OTHER): Payer: Self-pay | Admitting: Internal Medicine

## 2021-10-14 DIAGNOSIS — Z1231 Encounter for screening mammogram for malignant neoplasm of breast: Secondary | ICD-10-CM

## 2021-10-15 ENCOUNTER — Encounter (HOSPITAL_BASED_OUTPATIENT_CLINIC_OR_DEPARTMENT_OTHER): Payer: Self-pay

## 2021-10-15 ENCOUNTER — Ambulatory Visit (HOSPITAL_BASED_OUTPATIENT_CLINIC_OR_DEPARTMENT_OTHER)
Admission: RE | Admit: 2021-10-15 | Discharge: 2021-10-15 | Disposition: A | Discharge status: Home / Self Care | Payer: 59 | Source: Ambulatory Visit | Attending: Internal Medicine | Admitting: Internal Medicine

## 2021-10-15 ENCOUNTER — Other Ambulatory Visit (HOSPITAL_BASED_OUTPATIENT_CLINIC_OR_DEPARTMENT_OTHER): Payer: Self-pay

## 2021-10-15 DIAGNOSIS — Z1231 Encounter for screening mammogram for malignant neoplasm of breast: Secondary | ICD-10-CM | POA: Diagnosis not present

## 2021-11-12 ENCOUNTER — Other Ambulatory Visit (HOSPITAL_BASED_OUTPATIENT_CLINIC_OR_DEPARTMENT_OTHER): Payer: Self-pay

## 2021-11-12 MED ORDER — METOPROLOL TARTRATE 100 MG PO TABS
ORAL_TABLET | ORAL | 0 refills | Status: DC
  Filled 2021-11-12: qty 180, 90d supply, fill #0

## 2021-11-12 MED ORDER — AMLODIPINE BESYLATE 2.5 MG PO TABS
ORAL_TABLET | ORAL | 0 refills | Status: DC
  Filled 2021-11-12: qty 90, 90d supply, fill #0

## 2021-11-12 MED ORDER — SPIRONOLACTONE 25 MG PO TABS
ORAL_TABLET | ORAL | 0 refills | Status: DC
  Filled 2021-11-12: qty 90, 90d supply, fill #0

## 2021-11-12 MED ORDER — LISINOPRIL-HYDROCHLOROTHIAZIDE 20-25 MG PO TABS
ORAL_TABLET | ORAL | 0 refills | Status: DC
  Filled 2021-11-12 – 2022-02-03 (×2): qty 90, 90d supply, fill #0

## 2021-11-13 ENCOUNTER — Other Ambulatory Visit (HOSPITAL_BASED_OUTPATIENT_CLINIC_OR_DEPARTMENT_OTHER): Payer: Self-pay

## 2021-11-13 MED ORDER — NEBIVOLOL HCL 20 MG PO TABS
ORAL_TABLET | ORAL | 1 refills | Status: DC
  Filled 2021-11-13: qty 90, 90d supply, fill #0

## 2021-11-21 ENCOUNTER — Other Ambulatory Visit (HOSPITAL_BASED_OUTPATIENT_CLINIC_OR_DEPARTMENT_OTHER): Payer: Self-pay

## 2021-11-21 ENCOUNTER — Ambulatory Visit (INDEPENDENT_AMBULATORY_CARE_PROVIDER_SITE_OTHER): Payer: 59

## 2021-11-21 ENCOUNTER — Ambulatory Visit
Admission: EM | Admit: 2021-11-21 | Discharge: 2021-11-21 | Disposition: A | Discharge status: Home / Self Care | Payer: 59 | Attending: Emergency Medicine | Admitting: Emergency Medicine

## 2021-11-21 DIAGNOSIS — M5136 Other intervertebral disc degeneration, lumbar region: Secondary | ICD-10-CM | POA: Diagnosis not present

## 2021-11-21 DIAGNOSIS — M62838 Other muscle spasm: Secondary | ICD-10-CM | POA: Diagnosis not present

## 2021-11-21 DIAGNOSIS — M545 Low back pain, unspecified: Secondary | ICD-10-CM

## 2021-11-21 DIAGNOSIS — M5431 Sciatica, right side: Secondary | ICD-10-CM

## 2021-11-21 MED ORDER — METHYLPREDNISOLONE 4 MG PO TABS
ORAL_TABLET | ORAL | 0 refills | Status: AC
  Filled 2021-11-21: qty 40, 8d supply, fill #0

## 2021-11-21 MED ORDER — BACLOFEN 10 MG PO TABS
10.0000 mg | ORAL_TABLET | Freq: Every day | ORAL | 0 refills | Status: AC
  Filled 2021-11-21: qty 7, 7d supply, fill #0

## 2021-11-21 NOTE — ED Triage Notes (Addendum)
Patient states she has been having muscles pain. The patient states the pain now starts in her right hip and move down her leg (feels like pins and needles, spasm and shooting pain). The patient states the pain is more so in her right calf. Patient denies SOB.  Started: 2 weeks ago  Home interventions: motrin

## 2021-11-21 NOTE — Discharge Instructions (Addendum)
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  I am glad at this time your lower back is not bothering you but it is certainly causing inflammation of your sciatic nerve, particularly on the right.    I recommend you begin a tapering dose of an oral steroid called methylprednisolone over the next 8 days.  This will reduce the inflammation of your sciatic nerve and provide you with pain relief hopefully in the next 2 to 3 days.  Please do not take ibuprofen, naproxen while taking methylprednisolone.  I have also provided you with a muscle relaxer called baclofen that you can take at nighttime to prevent severe cramping that is waking you from your sleep.  This medication can cause morning "grogginess" so try to take it a few hours before you plan to go to bed so that it is completely worn off before you wake up the next morning.  The results of your blood test today will be available to your MyChart account in the next day or 2.  If there are any acutely abnormal findings, you will be contacted by phone with recommendations for treatment if needed.  This test evaluated your liver function, your kidney function, your sugar, protein and electrolyte levels.  Also recommend that you remain out of work for a few days, rest and do not perform any physical labor at home such as laundry, housecleaning, dishes or cooking.  Thank you for visiting urgent care today.  Please let us know if you have not had improvement of your symptoms after 10 days.

## 2021-11-21 NOTE — ED Provider Notes (Signed)
[facility]    CSN: [id number] Arrival date & time: 11/21/21  1104    HISTORY   Chief Complaint  Patient presents with   Leg Pain   HPI Janet Phelps is a 66 y.o. female. Patient presents to urgent care today stating she has been having pain in her muscles from her right hip down her right leg.  Patient states she also has a sensation of pins-and-needles, muscle spasms and shooting pain intermittently.  Patient states that his needle sensation is localized to her right calf.  Patient denies shortness of breath.  Patient states symptoms began about 2 weeks ago and that she has been taking Motrin with little relief.  Patient denies lower back pain at this time.  Patient states the muscle spasms cause her toes to curl, occur in the middle of the night and wake her from sleep, states she has to stand to straighten her toes out.  Patient reports a history of rhabdomyolysis after taking an over-the-counter supplement to lower her cholesterol called ubiquinol, also known as CoQ10, states she is concerned she is developing that again but states she is not currently taking CoQ10.  The history is provided by the patient.   Past Medical History:  Diagnosis Date   Diabetes mellitus without complication [facility])    gestational   Patient Active Problem List   Diagnosis Date Noted   Hypertension 05/09/2012   History of gestational diabetes 05/09/2012   Past Surgical History:  Procedure Laterality Date   CESAREAN SECTION     x3   CHOLECYSTECTOMY     COLPOSCOPY     TONSILLECTOMY     OB History     Gravida  8   Para  4   Term  4   Preterm      AB  4   Living  4      SAB  2   IAB  2   Ectopic      Multiple      Live Births             Home Medications    Prior to Admission medications   Medication Sig Start Date End Date Taking? Authorizing Provider  amLODipine (NORVASC) 2.5 MG tablet Take 1 tablet by mouth once daily 10/09/21     amLODipine  (NORVASC) 2.5 MG tablet Take 1 tablet by mouth once a day 11/12/21     Ascorbic Acid (VITAMIN C PO) Take by mouth.    [provider]  aspirin 81 MG tablet Take 81 mg by mouth 2 (two) times a week.    [provider]  atovaquone-proguanil (MALARONE) 250-100 MG TABS tablet Take 1 tablet by mouth as needed. 05/12/21   [provider]  B Complex Vitamins (VITAMIN-B COMPLEX PO) Take by mouth.    [provider]  lansoprazole (PREVACID) 30 MG capsule Take 30 mg by mouth daily. 05/14/21   [provider]  levothyroxine (SYNTHROID) 50 MCG tablet Take 50 mcg by mouth daily. 08/08/21   [provider]  lisinopril-hydrochlorothiazide (ZESTORETIC) 20-25 MG tablet Take 1 tablet by mouth once a day 11/12/21     Magnesium 400 MG TABS Take by mouth.    [provider]  Multiple Vitamin (MULTIVITAMIN) capsule Take 1 capsule by mouth daily.    [provider]  Nebivolol HCl 20 MG TABS Take 1 tablet by mouth once daily 11/13/21     spironolactone (ALDACTONE) 25 MG tablet Take 1 tablet by  mouth once a day 11/12/21       Family History Family History  Problem Relation Age of Onset   Hypertension Mother    Stroke Mother    Thyroid disease Mother    Hypertension Sister    Hypertension Sister    Hypertension Sister    Social History Social History   Tobacco Use   Smoking status: Never   Smokeless tobacco: Never  Vaping Use   Vaping Use: Never used  Substance Use Topics   Alcohol use: Yes    Comment: OCC   Drug use: No   Allergies   Labetalol  Review of Systems Review of Systems Pertinent findings noted in history of present illness.   Physical Exam Triage Vital Signs ED Triage Vitals  Enc Vitals Group     BP 04/04/21 0827 (!) 147/82     Pulse Rate 04/04/21 0827 72     Resp 04/04/21 0827 18     Temp 04/04/21 0827 98.3 F (36.8 C)     Temp Source 04/04/21 0827 Oral     SpO2 04/04/21 0827 98 %     Weight --      Height --       Head Circumference --      Peak Flow --      Pain Score 04/04/21 0826 5     Pain Loc --      Pain Edu? --      Excl. in GC? --   No data found.  Updated Vital Signs BP 124/76 (BP Location: Right Arm)   Pulse (!) 53   Temp 98.4 F (36.9 C) (Oral)   Resp 16   SpO2 97%   Physical Exam Vitals and nursing note reviewed.  Constitutional:      General: She is not in acute distress.    Appearance: Normal appearance. She is obese. She is not ill-appearing, toxic-appearing or diaphoretic.  HENT:     Head: Normocephalic and atraumatic.  Eyes:     General: Lids are normal.        Right eye: No discharge.        Left eye: No discharge.     Extraocular Movements: Extraocular movements intact.     Conjunctiva/sclera: Conjunctivae normal.     Right eye: Right conjunctiva is not injected.     Left eye: Left conjunctiva is not injected.  Neck:     Trachea: Trachea and phonation normal.  Cardiovascular:     Rate and Rhythm: Normal rate and regular rhythm.     Pulses: Normal pulses.     Heart sounds: Normal heart sounds. No murmur heard.    No friction rub. No gallop.  Pulmonary:     Effort: Pulmonary effort is normal. No accessory muscle usage, prolonged expiration or respiratory distress.     Breath sounds: Normal breath sounds. No stridor, decreased air movement or transmitted upper airway sounds. No decreased breath sounds, wheezing, rhonchi or rales.  Chest:     Chest wall: No tenderness.  Musculoskeletal:        General: Tenderness (Right SI joint, right gluteus medius tender to palpation) present. No swelling, deformity or signs of injury. Normal range of motion.     Cervical back: Normal range of motion and neck supple. Normal range of motion.     Right lower leg: No edema.     Left lower leg: No edema.  Lymphadenopathy:     Cervical: No cervical adenopathy.  Skin:  General: Skin is warm and dry.     Findings: No erythema or rash.  Neurological:     General: No focal  deficit present.     Mental Status: She is alert and oriented to person, place, and time.  Psychiatric:        Mood and Affect: Mood normal.        Behavior: Behavior normal.     Visual Acuity Right Eye Distance:   Left Eye Distance:   Bilateral Distance:    Right Eye Near:   Left Eye Near:    Bilateral Near:     [facility] Couse / Diagnostics / Procedures:    EKG  Radiology DG Lumbar Spine Complete  Result Date: 11/21/2021 CLINICAL DATA:  Muscle pain EXAM: LUMBAR SPINE - COMPLETE 4+ VIEW COMPARISON:  None Available. FINDINGS: Normal alignment of lumbar vertebral bodies. No loss of vertebral body height or disc height. No pars fracture. No subluxation. Degenerative osteophytosis of the spine. Multiple surgical clips in the RIGHT mid abdomen IMPRESSION: No acute findings lumbar spine.  Mild disc osteophytic disease. Electronically Signed   By: Genevive Bi M.D.   On: 11/21/2021 12:19    Procedures Procedures (including critical care time)  UC Diagnoses / Final Clinical Impressions(s)   I have reviewed the triage vital signs and the nursing notes.  Pertinent labs & imaging results that were available during my care of the patient were reviewed by me and considered in my medical decision making (see chart for details).    Final diagnoses:  Muscle spasm  Right sided sciatica  Lumbar degenerative disc disease    Patient advised of x-ray results. Patient was advised to:  Begin tapering dose of methylprednisolone Take baclofen once daily 1 hour prior to bedtime Return to urgent care in the next 2 to 3 days for repeat ketorolac injection if needed Return precautions advised   ED Prescriptions     Medication Sig Dispense Auth. Provider   methylPREDNISolone (MEDROL) 8 MG tablet Take 4 tablets (32 mg total) by mouth daily for 2 days, THEN 3 tablets (24 mg total) daily for 2 days, THEN 2 tablets (16 mg total) daily for 2 days, THEN 1 tablet (8 mg total) daily for 2 days. 20 tablet  Theadora Rama Scales, PA-C   baclofen (LIORESAL) 10 MG tablet Take 1 tablet (10 mg total) by mouth at bedtime for 7 days. 7 tablet Theadora Rama Scales, PA-C      PDMP not reviewed this encounter.  Pending results:  Labs Reviewed  COMPREHENSIVE METABOLIC PANEL    Medications Ordered in UC: Medications - No data to display  Disposition Upon Discharge:  Condition: stable for discharge home Home: take medications as prescribed; routine discharge instructions as discussed; follow up as advised.  Patient presented with an acute illness with associated systemic symptoms and significant discomfort requiring urgent management. In my opinion, this is a condition that a prudent lay person (someone who possesses an average knowledge of health and medicine) may potentially expect to result in complications if not addressed urgently such as respiratory distress, impairment of bodily function or dysfunction of bodily organs.   Routine symptom specific, illness specific and/or disease specific instructions were discussed with the patient and/or caregiver at length.   As such, the patient has been evaluated and assessed, work-up was performed and treatment was provided in alignment with urgent care protocols and evidence based medicine.  Patient/parent/caregiver has been advised that the patient may require follow up for further  testing and treatment if the symptoms continue in spite of treatment, as clinically indicated and appropriate.  If the patient was tested for COVID-19, Influenza and/or RSV, then the patient/parent/guardian was advised to isolate at home pending the results of his/her diagnostic coronavirus test and potentially longer if they're positive. I have also advised pt that if his/her COVID-19 test returns positive, it's recommended to self-isolate for at least 10 days after symptoms first appeared AND until fever-free for 24 hours without fever reducer AND other symptoms have improved  or resolved. Discussed self-isolation recommendations as well as instructions for household member/close contacts as per the [facility] and  [facility], and also gave patient the COVID packet with this information.  Patient/parent/caregiver has been advised to return to the [facility] or PCP in 3-5 days if no better; to PCP or the Emergency Department if new signs and symptoms develop, or if the current signs or symptoms continue to change or worsen for further workup, evaluation and treatment as clinically indicated and appropriate  The patient will follow up with their current PCP if and as advised. If the patient does not currently have a PCP we will assist them in obtaining one.   The patient may need specialty follow up if the symptoms continue, in spite of conservative treatment and management, for further workup, evaluation, consultation and treatment as clinically indicated and appropriate.   Patient/parent/caregiver verbalized understanding and agreement of plan as discussed.  All questions were addressed during visit.  Please see discharge instructions below for further details of plan.  Discharge Instructions:   Discharge Instructions      Your lumbar spine revealed mild to moderate degenerative arthritis.  I am glad at this time your lower back is not bothering you but it is certainly causing inflammation of your sciatic nerve, particularly on the right.    I recommend you begin a tapering dose of an oral steroid called methylprednisolone over the next 8 days.  This will reduce the inflammation of your sciatic nerve and provide you with pain relief hopefully in the next 2 to 3 days.  Please do not take ibuprofen, naproxen while taking methylprednisolone.  I have also provided you with a muscle relaxer called baclofen that you can take at nighttime to prevent severe cramping that is waking you from your sleep.  This medication can cause morning "grogginess" so try to take it a few hours before you plan  to go to bed so that it is completely worn off before you wake up the next morning.  The results of your blood test today will be available to your MyChart account in the next day or 2.  If there are any acutely abnormal findings, you will be contacted by phone with recommendations for treatment if needed.  This test evaluated your liver function, your kidney function, your sugar, protein and electrolyte levels.  Also recommend that you remain out of work for a few days, rest and do not perform any physical labor at home such as laundry, housecleaning, dishes or cooking.  Thank you for visiting urgent care today.  Please let us know if you have not had improvement of your symptoms after 10 days.        This office note has been dictated using Teaching laboratory technician.  Unfortunately, and despite my best efforts, this method of dictation can sometimes lead to occasional typographical or grammatical errors.  I apologize in advance if this occurs.     Theadora Rama Scales, PA-C  11/21/21 1232  

## 2021-11-22 LAB — COMPREHENSIVE METABOLIC PANEL
ALT: 13 IU/L (ref 0–32)
AST: 17 IU/L (ref 0–40)
Albumin/Globulin Ratio: 1.4 (ref 1.2–2.2)
Albumin: 4.2 g/dL (ref 3.8–4.8)
Alkaline Phosphatase: 57 IU/L (ref 44–121)
BUN/Creatinine Ratio: 20 (ref 12–28)
BUN: 21 mg/dL (ref 8–27)
Bilirubin Total: 0.6 mg/dL (ref 0.0–1.2)
CO2: 24 mmol/L (ref 20–29)
Calcium: 9.6 mg/dL (ref 8.7–10.3)
Chloride: 101 mmol/L (ref 96–106)
Creatinine, Ser: 1.04 mg/dL — ABNORMAL HIGH (ref 0.57–1.00)
Globulin, Total: 2.9 g/dL (ref 1.5–4.5)
Glucose: 103 mg/dL — ABNORMAL HIGH (ref 70–99)
Potassium: 4.7 mmol/L (ref 3.5–5.2)
Sodium: 139 mmol/L (ref 134–144)
Total Protein: 7.1 g/dL (ref 6.0–8.5)
eGFR: 60 mL/min/{1.73_m2} (ref >59)

## 2021-12-06 ENCOUNTER — Encounter (HOSPITAL_BASED_OUTPATIENT_CLINIC_OR_DEPARTMENT_OTHER): Payer: Self-pay | Admitting: Emergency Medicine

## 2021-12-06 ENCOUNTER — Emergency Department (HOSPITAL_BASED_OUTPATIENT_CLINIC_OR_DEPARTMENT_OTHER): Payer: 59

## 2021-12-06 ENCOUNTER — Other Ambulatory Visit: Payer: Self-pay

## 2021-12-06 ENCOUNTER — Emergency Department (HOSPITAL_BASED_OUTPATIENT_CLINIC_OR_DEPARTMENT_OTHER)
Admission: EM | Admit: 2021-12-06 | Discharge: 2021-12-06 | Disposition: A | Discharge status: Home / Self Care | Payer: 59 | Attending: Emergency Medicine | Admitting: Emergency Medicine

## 2021-12-06 DIAGNOSIS — M5136 Other intervertebral disc degeneration, lumbar region: Secondary | ICD-10-CM

## 2021-12-06 DIAGNOSIS — M5431 Sciatica, right side: Secondary | ICD-10-CM

## 2021-12-06 DIAGNOSIS — Z7982 Long term (current) use of aspirin: Secondary | ICD-10-CM | POA: Diagnosis not present

## 2021-12-06 DIAGNOSIS — Z79899 Other long term (current) drug therapy: Secondary | ICD-10-CM | POA: Diagnosis not present

## 2021-12-06 DIAGNOSIS — M5441 Lumbago with sciatica, right side: Secondary | ICD-10-CM | POA: Diagnosis not present

## 2021-12-06 DIAGNOSIS — M7138 Other bursal cyst, other site: Secondary | ICD-10-CM

## 2021-12-06 DIAGNOSIS — E119 Type 2 diabetes mellitus without complications: Secondary | ICD-10-CM | POA: Insufficient documentation

## 2021-12-06 DIAGNOSIS — M545 Low back pain, unspecified: Secondary | ICD-10-CM | POA: Diagnosis not present

## 2021-12-06 HISTORY — DX: Type 2 diabetes mellitus without complications: E11.9

## 2021-12-06 HISTORY — DX: Sciatica, unspecified side: M54.30

## 2021-12-06 MED ORDER — HYDROMORPHONE HCL 1 MG/ML IJ SOLN
0.5000 mg | Freq: Once | INTRAMUSCULAR | Status: AC
  Administered 2021-12-06: 0.5 mg via INTRAVENOUS
  Filled 2021-12-06: qty 1

## 2021-12-06 MED ORDER — PREDNISONE 20 MG PO TABS: ORAL_TABLET | ORAL | 0 refills | Status: DC

## 2021-12-06 MED ORDER — OXYCODONE-ACETAMINOPHEN 5-325 MG PO TABS: 1.0000 | ORAL_TABLET | Freq: Four times a day (QID) | ORAL | 0 refills | Status: DC | PRN

## 2021-12-06 MED ORDER — DEXAMETHASONE SODIUM PHOSPHATE 10 MG/ML IJ SOLN
10.0000 mg | Freq: Once | INTRAMUSCULAR | Status: AC
  Administered 2021-12-06: 10 mg via INTRAVENOUS
  Filled 2021-12-06: qty 1

## 2021-12-06 MED ORDER — GABAPENTIN 300 MG PO CAPS: ORAL_CAPSULE | ORAL | 0 refills | Status: DC

## 2021-12-06 MED ORDER — FENTANYL CITRATE PF 50 MCG/ML IJ SOSY
100.0000 ug | PREFILLED_SYRINGE | Freq: Once | INTRAMUSCULAR | Status: AC
  Administered 2021-12-06: 100 ug via INTRAVENOUS
  Filled 2021-12-06: qty 2

## 2021-12-06 NOTE — ED Provider Notes (Signed)
Signed out by Dr Read Drivers to check MRI results when back.  Awaiting MRI.  Recheck pt, pain returns, radicular right sided pain. Dilaudid iv. Decadron iv.   MRI resulted. Discussed w pt.   Recheck patient, pain improved.   Neurosurgery consulted, discussed w Julien Girt, and reviewed MRI - he indicates probably eventual need surgery, re synovial cyst with nerve impingement, but states for now d/c, course steroid, pain med, also try gabapentin for pain/nerve pain, and he will facilitate office f/u in the next 1-2 weeks.   Pt currently appears stable for d/c.   Return precautions provided.      Cathren Laine, MD 12/06/21 [phone]

## 2021-12-06 NOTE — ED Notes (Signed)
ED Provider at bedside. 

## 2021-12-06 NOTE — ED Provider Notes (Signed)
[facility] Provider Note: Janet Dell, MD, FACEP  CSN: [id number] MRN: [id number] ARRIVAL: 12/06/21 at 0600 ROOM: [facility]06/[facility]06   CHIEF COMPLAINT  Back Pain   HISTORY OF PRESENT ILLNESS  12/06/21 6:18 AM Janet Phelps is a 66 y.o. female with a history of sciatica.  She was seen on 11/21/2021 at a [place] urgent care and was placed on a methylprednisolone taper and baclofen.  She has also been taking Aleve.  This morning she rolled over in bed about 1 AM and felt a sudden change in her pain.  She is having right-sided lumbar pain radiating to her buttock and down the back of her right leg.  She describes the pain as burning in nature and worse with movement of her right hip.  She rates it as a 10 out of 10.  She is also having some paresthesias of the toes of the right foot.  She is able to ambulate but it is painful to do so.  She has had no bowel or bladder changes.  She is not able to get into neurosurgery until September.   Past Medical History:  Diagnosis Date   Diabetes mellitus (HCC)    Sciatica     Past Surgical History:  Procedure Laterality Date   CESAREAN SECTION     x3   CHOLECYSTECTOMY     COLPOSCOPY     TONSILLECTOMY      Family History  Problem Relation Age of Onset   Hypertension Mother    Stroke Mother    Thyroid disease Mother    Hypertension Sister    Hypertension Sister    Hypertension Sister     Social History   Tobacco Use   Smoking status: Never   Smokeless tobacco: Never  Vaping Use   Vaping Use: Never used  Substance Use Topics   Alcohol use: Yes    Comment: OCC   Drug use: No    Prior to Admission medications   Medication Sig Start Date End Date Taking? Authorizing Provider  amLODipine (NORVASC) 2.5 MG tablet Take 1 tablet by mouth once daily 10/09/21     amLODipine (NORVASC) 2.5 MG tablet Take 1 tablet by mouth once a day 11/12/21     Ascorbic Acid (VITAMIN C PO) Take by mouth.    [provider]   aspirin 81 MG tablet Take 81 mg by mouth 2 (two) times a week.    [provider]  atovaquone-proguanil (MALARONE) 250-100 MG TABS tablet Take 1 tablet by mouth as needed. 05/12/21   [provider]  B Complex Vitamins (VITAMIN-B COMPLEX PO) Take by mouth.    [provider]  lansoprazole (PREVACID) 30 MG capsule Take 30 mg by mouth daily. 05/14/21   [provider]  levothyroxine (SYNTHROID) 50 MCG tablet Take 50 mcg by mouth daily. 08/08/21   [provider]  lisinopril-hydrochlorothiazide (ZESTORETIC) 20-25 MG tablet Take 1 tablet by mouth once a day 11/12/21     Magnesium 400 MG TABS Take by mouth.    [provider]  Multiple Vitamin (MULTIVITAMIN) capsule Take 1 capsule by mouth daily.    [provider]  Nebivolol HCl 20 MG TABS Take 1 tablet by mouth once daily 11/13/21     spironolactone (ALDACTONE) 25 MG tablet Take 1 tablet by mouth once a day 11/12/21       Allergies Labetalol   REVIEW OF SYSTEMS  Negative except as noted here or in the History of  Present Illness.   PHYSICAL EXAMINATION  Initial Vital Signs Blood pressure (!) 174/90, pulse 64, temperature 98.5 F (36.9 C), temperature source Oral, resp. rate 20, height 5\' 5"  (1.651 m), weight 102.1 kg, SpO2 100 %.  Examination General: Well-developed, well-nourished female in no acute distress; appearance consistent with age of record HENT: normocephalic; atraumatic Eyes: Normal appearance Neck: supple Heart: regular rate and rhythm Lungs: clear to auscultation bilaterally Abdomen: soft; nondistended; nontender; bowel sounds present Back: Right paralumbar tenderness; pain on movement of right hip Extremities: No deformity; full range of motion; pulses normal Neurologic: Awake, alert and oriented; motor function intact in all extremities but examination limited due to pain on movement of right hip; decreased sensation in toes of right foot; no facial droop Skin:  Warm and dry Psychiatric: Normal mood and affect   RESULTS  Summary of this visit's results, reviewed and interpreted by myself:   EKG Interpretation  Date/Time:    Ventricular Rate:    PR Interval:    QRS Duration:   QT Interval:    QTC Calculation:   R Axis:     Text Interpretation:         Laboratory Studies: No results found for this or any previous visit (from the past 24 hour(s)). Imaging Studies: No results found.  ED COURSE and MDM  Nursing notes, initial and subsequent vitals signs, including pulse oximetry, reviewed and interpreted by myself.  Vitals:   12/06/21 0609 12/06/21 0611  BP:  (!) 174/90  Pulse:  64  Resp:  20  Temp: 98.5 F (36.9 C)   TempSrc: Oral   SpO2:  100%  Weight:  102.1 kg  Height:  5\' 5"  (1.651 m)   Medications  fentaNYL (SUBLIMAZE) injection 100 mcg (100 mcg Intravenous Given 12/06/21 0656)   The itinerant MRI scanner is present at this facility this morning and we will schedule her for a lumbar MRI.  7:00 AM Signed out to Dr. .   PROCEDURES  Procedures   ED DIAGNOSES     ICD-10-CM   1. Sciatica of right side  M54.31          Ruchama Kubicek, MD 12/06/21 [phone]

## 2021-12-06 NOTE — Discharge Instructions (Addendum)
It was our pleasure to provide your ER care today - we hope that you feel better.  Your MRI was read as showing: 1. 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 2. Disc bulging, facet and ligamentous hypertrophy at L3-4, contributing to mild lateral recess and foraminal narrowing bilaterally without definite nerve root encroachment. 3. Disc bulging, facet and ligamentous hypertrophy at L5-S1 with mild lateral recess and foraminal narrowing bilaterally.   We discussed your case with neurosurgery - they reviewed your MRI, and they indicate for you to follow up with them in the office in the next 1-2 weeks - call office Monday to arrange appointment time (tell them you were in ED, had MRI, and we discussed your case with Julien Girt who wanted you to follow up there closely).   Take prednisone as prescribed. You may also take percocet as need for pain. No driving for the next 6 hours or when taking percocet. Also, do not take tylenol or acetaminophen containing medication when taking percocet.  You may also try gabapentin as need, as prescribed for pain. (Our pharmacy is closed on the weekend, and [facility] is currently out of pain meds, so we sent your Rx to nearby [facility]).  Return to ER if worse, new symptoms, fevers, intractable pain, numbness/weakness, or other concern.

## 2021-12-06 NOTE — ED Triage Notes (Signed)
Pt c/o right sided back pain radiating to right buttock down right leg x 14 days. Pt has completed steroid pack and taking aleve without relief. Pt unable to get into Neurosurgery until sept. Needs MRI.

## 2021-12-18 DIAGNOSIS — G4733 Obstructive sleep apnea (adult) (pediatric): Secondary | ICD-10-CM | POA: Diagnosis not present

## 2021-12-29 ENCOUNTER — Other Ambulatory Visit (HOSPITAL_BASED_OUTPATIENT_CLINIC_OR_DEPARTMENT_OTHER): Payer: Self-pay

## 2021-12-29 DIAGNOSIS — M7138 Other bursal cyst, other site: Secondary | ICD-10-CM | POA: Diagnosis not present

## 2021-12-29 DIAGNOSIS — Z6838 Body mass index (BMI) 38.0-38.9, adult: Secondary | ICD-10-CM | POA: Diagnosis not present

## 2022-01-15 ENCOUNTER — Other Ambulatory Visit (HOSPITAL_BASED_OUTPATIENT_CLINIC_OR_DEPARTMENT_OTHER): Payer: Self-pay

## 2022-01-15 MED ORDER — POTASSIUM CHLORIDE CRYS ER 20 MEQ PO TBCR
20.0000 meq | EXTENDED_RELEASE_TABLET | Freq: Two times a day (BID) | ORAL | 0 refills | Status: DC
  Filled 2022-01-15: qty 180, 90d supply, fill #0

## 2022-01-15 MED ORDER — METOPROLOL TARTRATE 100 MG PO TABS
ORAL_TABLET | ORAL | 1 refills | Status: DC
  Filled 2022-01-15: qty 180, 90d supply, fill #0
  Filled 2022-07-16 – 2022-07-30 (×2): qty 180, 90d supply, fill #1

## 2022-02-03 ENCOUNTER — Other Ambulatory Visit (HOSPITAL_BASED_OUTPATIENT_CLINIC_OR_DEPARTMENT_OTHER): Payer: Self-pay

## 2022-02-09 ENCOUNTER — Other Ambulatory Visit (HOSPITAL_BASED_OUTPATIENT_CLINIC_OR_DEPARTMENT_OTHER): Payer: Self-pay

## 2022-02-10 ENCOUNTER — Other Ambulatory Visit (HOSPITAL_BASED_OUTPATIENT_CLINIC_OR_DEPARTMENT_OTHER): Payer: Self-pay

## 2022-02-11 ENCOUNTER — Other Ambulatory Visit (HOSPITAL_BASED_OUTPATIENT_CLINIC_OR_DEPARTMENT_OTHER): Payer: Self-pay

## 2022-02-11 DIAGNOSIS — M7138 Other bursal cyst, other site: Secondary | ICD-10-CM | POA: Diagnosis not present

## 2022-02-11 DIAGNOSIS — M5416 Radiculopathy, lumbar region: Secondary | ICD-10-CM | POA: Diagnosis not present

## 2022-02-11 MED ORDER — TIZANIDINE HCL 4 MG PO TABS
4.0000 mg | ORAL_TABLET | Freq: Three times a day (TID) | ORAL | 0 refills | Status: DC | PRN
  Filled 2022-02-11: qty 50, 17d supply, fill #0

## 2022-02-11 MED ORDER — TRAMADOL HCL 50 MG PO TABS
50.0000 mg | ORAL_TABLET | Freq: Four times a day (QID) | ORAL | 0 refills | Status: DC | PRN
  Filled 2022-02-11: qty 30, 8d supply, fill #0

## 2022-03-12 ENCOUNTER — Other Ambulatory Visit (HOSPITAL_BASED_OUTPATIENT_CLINIC_OR_DEPARTMENT_OTHER): Payer: Self-pay

## 2022-03-12 DIAGNOSIS — K649 Unspecified hemorrhoids: Secondary | ICD-10-CM | POA: Diagnosis not present

## 2022-03-12 DIAGNOSIS — K5900 Constipation, unspecified: Secondary | ICD-10-CM | POA: Diagnosis not present

## 2022-03-12 DIAGNOSIS — K6289 Other specified diseases of anus and rectum: Secondary | ICD-10-CM | POA: Diagnosis not present

## 2022-03-12 MED ORDER — HYDROCORTISONE (PERIANAL) 2.5 % EX CREA
1.0000 | TOPICAL_CREAM | Freq: Two times a day (BID) | CUTANEOUS | 1 refills | Status: DC
  Filled 2022-03-12 (×2): qty 30, 10d supply, fill #0

## 2022-03-19 ENCOUNTER — Other Ambulatory Visit (HOSPITAL_BASED_OUTPATIENT_CLINIC_OR_DEPARTMENT_OTHER): Payer: Self-pay

## 2022-04-01 ENCOUNTER — Other Ambulatory Visit (HOSPITAL_BASED_OUTPATIENT_CLINIC_OR_DEPARTMENT_OTHER): Payer: Self-pay

## 2022-04-02 ENCOUNTER — Other Ambulatory Visit (HOSPITAL_BASED_OUTPATIENT_CLINIC_OR_DEPARTMENT_OTHER): Payer: Self-pay

## 2022-04-02 MED ORDER — FLUAD QUADRIVALENT 0.5 ML IM PRSY
PREFILLED_SYRINGE | INTRAMUSCULAR | 0 refills | Status: DC
Start: 2022-04-02 — End: 2022-12-04
  Filled 2022-04-02: qty 0.5, 1d supply, fill #0

## 2022-04-08 ENCOUNTER — Other Ambulatory Visit (HOSPITAL_BASED_OUTPATIENT_CLINIC_OR_DEPARTMENT_OTHER): Payer: Self-pay

## 2022-04-08 MED ORDER — POTASSIUM CHLORIDE CRYS ER 20 MEQ PO TBCR
20.0000 meq | EXTENDED_RELEASE_TABLET | Freq: Two times a day (BID) | ORAL | 0 refills | Status: DC
Start: 2022-04-08 — End: 2022-10-28
  Filled 2022-04-08: qty 180, 90d supply, fill #0

## 2022-04-09 DIAGNOSIS — G4733 Obstructive sleep apnea (adult) (pediatric): Secondary | ICD-10-CM | POA: Diagnosis not present

## 2022-04-13 ENCOUNTER — Other Ambulatory Visit: Payer: Self-pay

## 2022-04-16 ENCOUNTER — Other Ambulatory Visit (HOSPITAL_BASED_OUTPATIENT_CLINIC_OR_DEPARTMENT_OTHER): Payer: Self-pay

## 2022-04-22 ENCOUNTER — Other Ambulatory Visit (HOSPITAL_BASED_OUTPATIENT_CLINIC_OR_DEPARTMENT_OTHER): Payer: Self-pay

## 2022-04-22 DIAGNOSIS — K219 Gastro-esophageal reflux disease without esophagitis: Secondary | ICD-10-CM | POA: Diagnosis not present

## 2022-04-22 DIAGNOSIS — E782 Mixed hyperlipidemia: Secondary | ICD-10-CM | POA: Diagnosis not present

## 2022-04-22 DIAGNOSIS — I10 Essential (primary) hypertension: Secondary | ICD-10-CM | POA: Diagnosis not present

## 2022-04-22 DIAGNOSIS — E039 Hypothyroidism, unspecified: Secondary | ICD-10-CM | POA: Diagnosis not present

## 2022-04-22 DIAGNOSIS — R7303 Prediabetes: Secondary | ICD-10-CM | POA: Diagnosis not present

## 2022-04-22 MED ORDER — AMLODIPINE BESYLATE 2.5 MG PO TABS
2.5000 mg | ORAL_TABLET | Freq: Every day | ORAL | 1 refills | Status: DC
  Filled 2022-04-22: qty 90, 90d supply, fill #0
  Filled 2022-07-16: qty 90, 90d supply, fill #1

## 2022-05-14 ENCOUNTER — Other Ambulatory Visit (HOSPITAL_BASED_OUTPATIENT_CLINIC_OR_DEPARTMENT_OTHER): Payer: Self-pay

## 2022-05-14 DIAGNOSIS — R7303 Prediabetes: Secondary | ICD-10-CM | POA: Diagnosis not present

## 2022-05-14 DIAGNOSIS — E782 Mixed hyperlipidemia: Secondary | ICD-10-CM | POA: Diagnosis not present

## 2022-05-14 DIAGNOSIS — E039 Hypothyroidism, unspecified: Secondary | ICD-10-CM | POA: Diagnosis not present

## 2022-05-14 DIAGNOSIS — I10 Essential (primary) hypertension: Secondary | ICD-10-CM | POA: Diagnosis not present

## 2022-05-14 DIAGNOSIS — K219 Gastro-esophageal reflux disease without esophagitis: Secondary | ICD-10-CM | POA: Diagnosis not present

## 2022-05-14 MED ORDER — METOPROLOL TARTRATE 50 MG PO TABS
50.0000 mg | ORAL_TABLET | Freq: Two times a day (BID) | ORAL | 1 refills | Status: DC
  Filled 2022-05-14: qty 180, 90d supply, fill #0
  Filled 2022-07-22 – 2022-07-27 (×2): qty 180, 90d supply, fill #1

## 2022-05-14 MED ORDER — AMLODIPINE BESY-BENAZEPRIL HCL 5-20 MG PO CAPS
1.0000 | ORAL_CAPSULE | Freq: Every day | ORAL | 1 refills | Status: DC
Start: 2022-05-14 — End: 2024-01-13
  Filled 2022-05-14: qty 90, 90d supply, fill #0
  Filled 2022-08-06: qty 90, 90d supply, fill #1

## 2022-05-14 MED ORDER — LEVOTHYROXINE SODIUM 50 MCG PO TABS
50.0000 ug | ORAL_TABLET | Freq: Every day | ORAL | 1 refills | Status: DC
Start: 2022-05-14 — End: 2022-08-20
  Filled 2022-05-14: qty 90, 90d supply, fill #0
  Filled 2022-07-16 – 2022-08-06 (×2): qty 90, 90d supply, fill #1

## 2022-05-15 ENCOUNTER — Other Ambulatory Visit (HOSPITAL_BASED_OUTPATIENT_CLINIC_OR_DEPARTMENT_OTHER): Payer: Self-pay

## 2022-05-15 MED ORDER — COMIRNATY 30 MCG/0.3ML IM SUSY
PREFILLED_SYRINGE | INTRAMUSCULAR | 0 refills | Status: DC
Start: 2022-05-15 — End: 2022-12-04
  Filled 2022-05-15: qty 0.3, 1d supply, fill #0

## 2022-06-05 ENCOUNTER — Ambulatory Visit
Admission: EM | Admit: 2022-06-05 | Discharge: 2022-06-05 | Disposition: A | Discharge status: Home / Self Care | Payer: 59 | Attending: Emergency Medicine | Admitting: Emergency Medicine

## 2022-06-05 ENCOUNTER — Ambulatory Visit: Payer: 59

## 2022-06-05 ENCOUNTER — Ambulatory Visit (INDEPENDENT_AMBULATORY_CARE_PROVIDER_SITE_OTHER): Payer: 59

## 2022-06-05 DIAGNOSIS — K5904 Chronic idiopathic constipation: Secondary | ICD-10-CM | POA: Diagnosis not present

## 2022-06-05 DIAGNOSIS — Z9049 Acquired absence of other specified parts of digestive tract: Secondary | ICD-10-CM

## 2022-06-05 DIAGNOSIS — G8929 Other chronic pain: Secondary | ICD-10-CM

## 2022-06-05 DIAGNOSIS — R1011 Right upper quadrant pain: Secondary | ICD-10-CM

## 2022-06-05 NOTE — Discharge Instructions (Signed)
The results of your abdominal x-ray did not reveal any acute bowel obstruction.  Per my personal interpretation, your liver appears to be slightly enlarged.  There is nothing seen on your x-ray that can explain the pain that you are having in your right upper quadrant that is radiating to your right upper back at this time.  I recommend that you follow-up with your primary care provider to discuss more advanced imaging such as abdominal ultrasound.  In the meantime, please continue taking Colace and fiber supplements, drinking plenty of water and getting regular exercise to encourage regular bowel movements.  Thank you for visiting [place] here at urgent care.

## 2022-06-05 NOTE — ED Triage Notes (Signed)
Pt c/o RUQ abd pain (spasm) that radiates to right upper back. The patient states the pain is worse with movement. She states two days ago she noticed a knot to the RUQ.  Started: a few months ago  Home interventions: tylenol

## 2022-06-05 NOTE — ED Provider Notes (Signed)
[facility]    CSN: [id number] Arrival date & time: 06/05/22  1004    HISTORY   Chief Complaint  Patient presents with   Abdominal Pain   HPI Janet Phelps is a pleasant, 66 y.o. female who presents to urgent care today. Patient complains of 6 to 7 months of intermittent right upper quadrant pain that radiates to her back occasionally.  Patient states she had cholecystectomy in 1990.  Patient states the pain is sometimes worsened with movement but she has not noticed that her activities have been limited secondary to pain.  Patient has normal vital signs on arrival today.  Patient self referred to gastroenterology and was seen by gastroenterology on March 12, 2022.  At that visit, she was diagnosed with unspecified constipation, hemorrhoids.  Per encounter note, it was also noted that patient had a colonoscopy and 2019 which revealed a tubular adenoma, which was removed and she was advised to have repeat colonoscopy in 5 years.  Patient states she currently takes Colace and a fiber supplement every day, states she moves her bowels every day.  Patient states she is also noticed a bulge beneath her incision where she had her cholecystectomy which she states has been intermittently painful, states is not bulging at this time.  The history is provided by the patient.   Past Medical History:  Diagnosis Date   Diabetes mellitus [facility])    Sciatica    Patient Active Problem List   Diagnosis Date Noted   Hypertension 05/09/2012   History of gestational diabetes 05/09/2012   Past Surgical History:  Procedure Laterality Date   CESAREAN SECTION     x3   CHOLECYSTECTOMY     COLPOSCOPY     TONSILLECTOMY     OB History     Gravida  8   Para  4   Term  4   Preterm      AB  4   Living  4      SAB  2   IAB  2   Ectopic      Multiple      Live Births             Home Medications    Prior to Admission medications   Medication Sig Start Date End  Date Taking? Authorizing Provider  amLODipine (NORVASC) 2.5 MG tablet Take 1 tablet by mouth once daily 10/09/21     amLODipine (NORVASC) 2.5 MG tablet Take 1 tablet by mouth once a day 11/12/21     amLODipine (NORVASC) 2.5 MG tablet Take 1 tablet (2.5 mg total) by mouth daily. 04/22/22     amLODipine-benazepril (LOTREL) 5-20 MG capsule Take 1 capsule by mouth daily. 05/14/22     Ascorbic Acid (VITAMIN C PO) Take by mouth.    [provider]  aspirin 81 MG tablet Take 81 mg by mouth 2 (two) times a week.    [provider]  atovaquone-proguanil (MALARONE) 250-100 MG TABS tablet Take 1 tablet by mouth as needed. 05/12/21   [provider]  B Complex Vitamins (VITAMIN-B COMPLEX PO) Take by mouth.    [provider]  COVID-19 mRNA vaccine [phone] (COMIRNATY) syringe Inject into the muscle. 05/15/22   Judyann Munson, MD  gabapentin (NEURONTIN) 300 MG capsule On 1st day, take one tablet once. On 2nd day, take one tablet bid.  Then, starting on the 3rd day, take one tablet 3x/day. 12/06/21   Cathren Laine, MD  hydrocortisone (ANUSOL-HC) 2.5 %  rectal cream Place 1 Application rectally 2 (two) times daily. 03/12/22     influenza vaccine adjuvanted (FLUAD QUADRIVALENT) 0.5 ML injection Inject into the muscle. 04/02/22   Judyann Munson, MD  lansoprazole (PREVACID) 30 MG capsule Take 30 mg by mouth daily. 05/14/21   [provider]  levothyroxine (SYNTHROID) 50 MCG tablet Take 50 mcg by mouth daily. 08/08/21   [provider]  levothyroxine (SYNTHROID) 50 MCG tablet Take 1 tablet (50 mcg total) by mouth daily. 05/14/22     lisinopril-hydrochlorothiazide (ZESTORETIC) 20-25 MG tablet Take 1 tablet by mouth once a day 11/12/21     Magnesium 400 MG TABS Take by mouth.    [provider]  metoprolol tartrate (LOPRESSOR) 100 MG tablet Take 1 tablet by mouth 2 times a day 90 days 01/15/22     metoprolol tartrate (LOPRESSOR) 50 MG tablet Take 1 tablet (50 mg total)  by mouth 2 (two) times daily. 05/14/22     Multiple Vitamin (MULTIVITAMIN) capsule Take 1 capsule by mouth daily.    [provider]  Nebivolol HCl 20 MG TABS Take 1 tablet by mouth once daily 11/13/21     oxyCODONE-acetaminophen (PERCOCET/ROXICET) 5-325 MG tablet Take 1-2 tablets by mouth every 6 (six) hours as needed for severe pain. 12/06/21   Cathren Laine, MD  potassium chloride SA (KLOR-CON M) 20 MEQ tablet Take 1 tablet (20 mEq total) by mouth 2 (two) times daily. 04/08/22     predniSONE (DELTASONE) 20 MG tablet 3 po once a day for 2 days, then 2 po once a day for 3 days, then 1 po once a day for 3 days 12/07/21   Cathren Laine, MD  spironolactone (ALDACTONE) 25 MG tablet Take 1 tablet by mouth once a day 11/12/21     tiZANidine (ZANAFLEX) 4 MG tablet Take 1 tablet (4 mg total) by mouth every 6 (six) to 8 (eight) hours as needed, not to exceed 3 doses in 24 hours. 02/11/22     traMADol (ULTRAM) 50 MG tablet Take 1 tablet (50 mg total) by mouth every 6 (six) hours as needed. 02/11/22       Family History Family History  Problem Relation Age of Onset   Hypertension Mother    Stroke Mother    Thyroid disease Mother    Hypertension Sister    Hypertension Sister    Hypertension Sister    Social History Social History   Tobacco Use   Smoking status: Never   Smokeless tobacco: Never  Vaping Use   Vaping Use: Never used  Substance Use Topics   Alcohol use: Yes    Comment: OCC   Drug use: No   Allergies   Labetalol and Benazepril  Review of Systems Review of Systems Pertinent findings revealed after performing a 14 point review of systems has been noted in the history of present illness.  Physical Exam Triage Vital Signs ED Triage Vitals  Enc Vitals Group     BP 04/04/21 0827 (!) 147/82     Pulse Rate 04/04/21 0827 72     Resp 04/04/21 0827 18     Temp 04/04/21 0827 98.3 F (36.8 C)     Temp Source 04/04/21 0827 Oral     SpO2 04/04/21 0827 98 %     Weight --      Height  --      Head Circumference --      Peak Flow --      Pain Score 04/04/21 0826  5     Pain Loc --      Pain Edu? --      Excl. in GC? --   No data found.  Updated Vital Signs BP 120/64 (BP Location: Left Arm)   Pulse 70   Temp 98.4 F (36.9 C) (Oral)   Resp 16   SpO2 97%   Physical Exam Vitals and nursing note reviewed.  Constitutional:      General: She is awake. She is not in acute distress.    Appearance: Normal appearance. She is well-developed and well-groomed. She is obese. She is not ill-appearing, toxic-appearing or diaphoretic.  HENT:     Head: Normocephalic and atraumatic.  Eyes:     General: Lids are normal.        Right eye: No discharge.        Left eye: No discharge.     Extraocular Movements: Extraocular movements intact.     Conjunctiva/sclera: Conjunctivae normal.     Right eye: Right conjunctiva is not injected.     Left eye: Left conjunctiva is not injected.  Neck:     Trachea: Trachea and phonation normal.  Cardiovascular:     Rate and Rhythm: Normal rate and regular rhythm.     Pulses: Normal pulses.     Heart sounds: Normal heart sounds. No murmur heard.    No friction rub. No gallop.  Pulmonary:     Effort: Pulmonary effort is normal. No accessory muscle usage, prolonged expiration or respiratory distress.     Breath sounds: Normal breath sounds. No stridor, decreased air movement or transmitted upper airway sounds. No decreased breath sounds, wheezing, rhonchi or rales.  Chest:     Chest wall: No tenderness.  Abdominal:     General: Abdomen is flat. A surgical scar is present. Bowel sounds are normal. There is no distension.     Palpations: Abdomen is soft.     Tenderness: There is generalized abdominal tenderness. There is no right CVA tenderness, left CVA tenderness, guarding or rebound. Negative signs include Murphy's sign, Rovsing's sign, McBurney's sign, psoas sign and obturator sign.     Hernia: No hernia is present.    Musculoskeletal:         General: Normal range of motion.     Cervical back: Normal range of motion and neck supple. Normal range of motion.  Lymphadenopathy:     Cervical: No cervical adenopathy.  Skin:    General: Skin is warm and dry.     Findings: No erythema or rash.  Neurological:     General: No focal deficit present.     Mental Status: She is alert and oriented to person, place, and time.  Psychiatric:        Mood and Affect: Mood normal.        Behavior: Behavior normal. Behavior is cooperative.     Visual Acuity Right Eye Distance:   Left Eye Distance:   Bilateral Distance:    Right Eye Near:   Left Eye Near:    Bilateral Near:     UC Couse / Diagnostics / Procedures:     Radiology DG Abdomen 1 View  Result Date: 06/05/2022 CLINICAL DATA:  RIGHT upper quadrant abdominal pain radiating to RIGHT upper back worse with movement, knot in RIGHT upper quadrant noticed 2 days ago EXAM: ABDOMEN - 1 VIEW COMPARISON:  CT abdomen and pelvis 01/07/2017 FINDINGS: Scattered stool throughout colon. Nonobstructive bowel gas pattern. No bowel dilatation or bowel wall thickening.  Gallbladder surgically absent. No definite urinary tract calcification or acute bone lesion. IMPRESSION: Nonspecific bowel gas pattern. Electronically Signed   By: Ulyses SouthwardMark  Boles M.D.   On: 06/05/2022 13:58    Procedures Procedures (including critical care time) EKG  Pending results:  Labs Reviewed - No data to display  Medications Ordered in UC: Medications - No data to display  UC Diagnoses / Final Clinical Impressions(s)   I have reviewed the triage vital signs and the nursing notes.  Pertinent labs & imaging results that were available during my care of the patient were reviewed by me and considered in my medical decision making (see chart for details).    Final diagnoses:  Chronic idiopathic constipation  Abdominal pain, chronic, right upper quadrant  History of cholecystectomy   Patient advised of x-ray findings  and advised to follow-up with her primary care provider for further evaluation.  Please see discharge instructions below for details of plan of care as provided to patient. ED Prescriptions   None    PDMP not reviewed this encounter.  Pending results:  Labs Reviewed - No data to display  Discharge Instructions:   Discharge Instructions      The results of your abdominal x-ray did not reveal any acute bowel obstruction.  Per my personal interpretation, your liver appears to be slightly enlarged.  There is nothing seen on your x-ray that can explain the pain that you are having in your right upper quadrant that is radiating to your right upper back at this time.  I recommend that you follow-up with your primary care provider to discuss more advanced imaging such as abdominal ultrasound.  In the meantime, please continue taking Colace and fiber supplements, drinking plenty of water and getting regular exercise to encourage regular bowel movements.  Thank you for visiting us here at urgent care.      Disposition Upon Discharge:  Condition: stable for discharge home  Patient presented with an acute illness with associated systemic symptoms and significant discomfort requiring urgent management. In my opinion, this is a condition that a prudent lay person (someone who possesses an average knowledge of health and medicine) may potentially expect to result in complications if not addressed urgently such as respiratory distress, impairment of bodily function or dysfunction of bodily organs.   Routine symptom specific, illness specific and/or disease specific instructions were discussed with the patient and/or caregiver at length.   As such, the patient has been evaluated and assessed, work-up was performed and treatment was provided in alignment with urgent care protocols and evidence based medicine.  Patient/parent/caregiver has been advised that the patient may require follow up for  further testing and treatment if the symptoms continue in spite of treatment, as clinically indicated and appropriate.  Patient/parent/caregiver [facility] been [facility] if no better; to PCP or the Emergency Department if new signs and symptoms develop, or if the current signs or symptoms continue to change or worsen for further workup, evaluation and treatment as clinically indicated and appropriate  The patient will follow up with their current PCP if and as advised. If the patient does not currently have a PCP we will assist them in obtaining one.   The patient may need specialty follow up if the symptoms continue, in spite of conservative treatment and management, for further workup, evaluation, consultation and treatment as clinically indicated and appropriate.  Patient/parent/caregiver verbalized understanding and agreement of plan as discussed.  All questions were addressed  during visit.  Please see discharge instructions below for further details of plan.  This office note has been dictated using Teaching laboratory technician.  Unfortunately, this method of dictation can sometimes lead to typographical or grammatical errors.  I apologize for your inconvenience in advance if this occurs.  Please do not hesitate to reach out to me if clarification is needed.      Theadora Rama Scales, PA-C 06/05/22 1429

## 2022-06-18 ENCOUNTER — Other Ambulatory Visit (HOSPITAL_BASED_OUTPATIENT_CLINIC_OR_DEPARTMENT_OTHER): Payer: Self-pay

## 2022-06-18 DIAGNOSIS — E039 Hypothyroidism, unspecified: Secondary | ICD-10-CM | POA: Diagnosis not present

## 2022-06-18 DIAGNOSIS — R7303 Prediabetes: Secondary | ICD-10-CM | POA: Diagnosis not present

## 2022-06-18 DIAGNOSIS — I10 Essential (primary) hypertension: Secondary | ICD-10-CM | POA: Diagnosis not present

## 2022-06-18 DIAGNOSIS — K219 Gastro-esophageal reflux disease without esophagitis: Secondary | ICD-10-CM | POA: Diagnosis not present

## 2022-06-18 DIAGNOSIS — E782 Mixed hyperlipidemia: Secondary | ICD-10-CM | POA: Diagnosis not present

## 2022-06-18 DIAGNOSIS — M545 Low back pain, unspecified: Secondary | ICD-10-CM | POA: Diagnosis not present

## 2022-06-18 MED ORDER — AMLODIPINE-OLMESARTAN 5-20 MG PO TABS
1.0000 | ORAL_TABLET | Freq: Every day | ORAL | 5 refills | Status: DC
  Filled 2022-06-18: qty 30, 30d supply, fill #0
  Filled 2022-07-23 – 2022-07-30 (×2): qty 30, 30d supply, fill #1

## 2022-06-18 MED ORDER — METOPROLOL TARTRATE 50 MG PO TABS
50.0000 mg | ORAL_TABLET | Freq: Two times a day (BID) | ORAL | 1 refills | Status: DC
  Filled 2022-06-18 – 2022-10-21 (×3): qty 180, 90d supply, fill #0

## 2022-06-18 MED ORDER — TIZANIDINE HCL 4 MG PO TABS
4.0000 mg | ORAL_TABLET | Freq: Three times a day (TID) | ORAL | 3 refills | Status: DC | PRN
  Filled 2022-06-18: qty 30, 10d supply, fill #0

## 2022-07-01 ENCOUNTER — Other Ambulatory Visit (HOSPITAL_BASED_OUTPATIENT_CLINIC_OR_DEPARTMENT_OTHER): Payer: Self-pay

## 2022-07-01 DIAGNOSIS — N898 Other specified noninflammatory disorders of vagina: Secondary | ICD-10-CM | POA: Diagnosis not present

## 2022-07-01 MED ORDER — FLUCONAZOLE 150 MG PO TABS
150.0000 mg | ORAL_TABLET | ORAL | 0 refills | Status: AC
  Filled 2022-07-01: qty 2, 3d supply, fill #0

## 2022-07-01 MED ORDER — NITROFURANTOIN MONOHYD MACRO 100 MG PO CAPS
100.0000 mg | ORAL_CAPSULE | Freq: Two times a day (BID) | ORAL | 0 refills | Status: DC
  Filled 2022-07-01: qty 14, 7d supply, fill #0

## 2022-07-16 ENCOUNTER — Other Ambulatory Visit: Payer: Self-pay

## 2022-07-16 ENCOUNTER — Other Ambulatory Visit (HOSPITAL_BASED_OUTPATIENT_CLINIC_OR_DEPARTMENT_OTHER): Payer: Self-pay

## 2022-07-17 ENCOUNTER — Other Ambulatory Visit (HOSPITAL_BASED_OUTPATIENT_CLINIC_OR_DEPARTMENT_OTHER): Payer: Self-pay

## 2022-07-20 ENCOUNTER — Other Ambulatory Visit (HOSPITAL_BASED_OUTPATIENT_CLINIC_OR_DEPARTMENT_OTHER): Payer: Self-pay

## 2022-07-20 MED ORDER — POTASSIUM CHLORIDE ER 20 MEQ PO TBCR
20.0000 meq | EXTENDED_RELEASE_TABLET | Freq: Two times a day (BID) | ORAL | 0 refills | Status: DC
  Filled 2022-07-20: qty 60, 30d supply, fill #0

## 2022-07-22 ENCOUNTER — Other Ambulatory Visit (HOSPITAL_BASED_OUTPATIENT_CLINIC_OR_DEPARTMENT_OTHER): Payer: Self-pay

## 2022-07-29 ENCOUNTER — Other Ambulatory Visit (HOSPITAL_BASED_OUTPATIENT_CLINIC_OR_DEPARTMENT_OTHER): Payer: Self-pay

## 2022-08-05 DIAGNOSIS — N644 Mastodynia: Secondary | ICD-10-CM | POA: Diagnosis not present

## 2022-08-05 DIAGNOSIS — R92323 Mammographic fibroglandular density, bilateral breasts: Secondary | ICD-10-CM | POA: Diagnosis not present

## 2022-08-06 ENCOUNTER — Other Ambulatory Visit (HOSPITAL_BASED_OUTPATIENT_CLINIC_OR_DEPARTMENT_OTHER): Payer: Self-pay

## 2022-08-12 DIAGNOSIS — G4733 Obstructive sleep apnea (adult) (pediatric): Secondary | ICD-10-CM | POA: Diagnosis not present

## 2022-08-14 ENCOUNTER — Other Ambulatory Visit (HOSPITAL_BASED_OUTPATIENT_CLINIC_OR_DEPARTMENT_OTHER): Payer: Self-pay

## 2022-08-20 ENCOUNTER — Other Ambulatory Visit (HOSPITAL_BASED_OUTPATIENT_CLINIC_OR_DEPARTMENT_OTHER): Payer: Self-pay

## 2022-08-20 DIAGNOSIS — E039 Hypothyroidism, unspecified: Secondary | ICD-10-CM | POA: Diagnosis not present

## 2022-08-20 DIAGNOSIS — I10 Essential (primary) hypertension: Secondary | ICD-10-CM | POA: Diagnosis not present

## 2022-08-20 DIAGNOSIS — Z2989 Encounter for other specified prophylactic measures: Secondary | ICD-10-CM | POA: Diagnosis not present

## 2022-08-20 DIAGNOSIS — A09 Infectious gastroenteritis and colitis, unspecified: Secondary | ICD-10-CM | POA: Diagnosis not present

## 2022-08-20 DIAGNOSIS — K219 Gastro-esophageal reflux disease without esophagitis: Secondary | ICD-10-CM | POA: Diagnosis not present

## 2022-08-20 DIAGNOSIS — R7303 Prediabetes: Secondary | ICD-10-CM | POA: Diagnosis not present

## 2022-08-20 DIAGNOSIS — E782 Mixed hyperlipidemia: Secondary | ICD-10-CM | POA: Diagnosis not present

## 2022-08-20 MED ORDER — SPIRONOLACTONE 25 MG PO TABS
25.0000 mg | ORAL_TABLET | Freq: Every day | ORAL | 1 refills | Status: DC
  Filled 2022-08-20: qty 90, 90d supply, fill #0
  Filled 2022-11-16: qty 90, 90d supply, fill #1

## 2022-08-20 MED ORDER — METRONIDAZOLE 500 MG PO TABS
500.0000 mg | ORAL_TABLET | Freq: Three times a day (TID) | ORAL | 0 refills | Status: DC
  Filled 2022-08-20: qty 21, 7d supply, fill #0

## 2022-08-20 MED ORDER — LOSARTAN POTASSIUM-HCTZ 50-12.5 MG PO TABS
1.0000 | ORAL_TABLET | Freq: Every day | ORAL | 1 refills | Status: DC
Start: 2022-08-20 — End: 2022-10-28
  Filled 2022-08-20: qty 90, 90d supply, fill #0

## 2022-08-20 MED ORDER — CIPROFLOXACIN HCL 500 MG PO TABS
500.0000 mg | ORAL_TABLET | Freq: Two times a day (BID) | ORAL | 0 refills | Status: DC
  Filled 2022-08-20: qty 20, 10d supply, fill #0

## 2022-08-20 MED ORDER — MEFLOQUINE HCL 250 MG PO TABS
250.0000 mg | ORAL_TABLET | ORAL | 0 refills | Status: DC
  Filled 2022-08-20: qty 10, 70d supply, fill #0

## 2022-08-20 MED ORDER — METOPROLOL SUCCINATE ER 25 MG PO TB24
25.0000 mg | ORAL_TABLET | Freq: Every day | ORAL | 1 refills | Status: DC
  Filled 2022-08-20: qty 90, 90d supply, fill #0

## 2022-08-20 MED ORDER — LEVOTHYROXINE SODIUM 50 MCG PO TABS
50.0000 ug | ORAL_TABLET | Freq: Every day | ORAL | 1 refills | Status: DC
  Filled 2022-08-20 – 2022-11-04 (×2): qty 90, 90d supply, fill #0
  Filled 2023-01-29: qty 90, 90d supply, fill #1

## 2022-08-20 MED ORDER — LANSOPRAZOLE 30 MG PO CPDR
30.0000 mg | DELAYED_RELEASE_CAPSULE | Freq: Every day | ORAL | 1 refills | Status: DC
  Filled 2022-08-20: qty 90, 90d supply, fill #0

## 2022-08-20 MED ORDER — LOSARTAN POTASSIUM-HCTZ 50-12.5 MG PO TABS
1.0000 | ORAL_TABLET | Freq: Every day | ORAL | 1 refills | Status: DC
  Filled 2022-08-20 – 2022-10-21 (×2): qty 90, 90d supply, fill #0

## 2022-08-21 ENCOUNTER — Other Ambulatory Visit (HOSPITAL_BASED_OUTPATIENT_CLINIC_OR_DEPARTMENT_OTHER): Payer: Self-pay

## 2022-08-24 ENCOUNTER — Other Ambulatory Visit (HOSPITAL_BASED_OUTPATIENT_CLINIC_OR_DEPARTMENT_OTHER): Payer: Self-pay

## 2022-09-23 DIAGNOSIS — G4733 Obstructive sleep apnea (adult) (pediatric): Secondary | ICD-10-CM | POA: Diagnosis not present

## 2022-09-25 ENCOUNTER — Other Ambulatory Visit (HOSPITAL_BASED_OUTPATIENT_CLINIC_OR_DEPARTMENT_OTHER): Payer: Self-pay

## 2022-09-28 ENCOUNTER — Other Ambulatory Visit (HOSPITAL_BASED_OUTPATIENT_CLINIC_OR_DEPARTMENT_OTHER): Payer: Self-pay

## 2022-10-01 ENCOUNTER — Other Ambulatory Visit (HOSPITAL_BASED_OUTPATIENT_CLINIC_OR_DEPARTMENT_OTHER): Payer: Self-pay

## 2022-10-01 MED ORDER — POTASSIUM CHLORIDE ER 20 MEQ PO TBCR
20.0000 meq | EXTENDED_RELEASE_TABLET | Freq: Every day | ORAL | 2 refills | Status: DC
  Filled 2022-10-01 – 2022-10-20 (×3): qty 30, 30d supply, fill #0

## 2022-10-01 MED ORDER — LOSARTAN POTASSIUM-HCTZ 50-12.5 MG PO TABS
1.0000 | ORAL_TABLET | Freq: Every day | ORAL | 1 refills | Status: DC
  Filled 2022-10-01 – 2022-11-19 (×3): qty 90, 90d supply, fill #0

## 2022-10-02 ENCOUNTER — Other Ambulatory Visit (HOSPITAL_BASED_OUTPATIENT_CLINIC_OR_DEPARTMENT_OTHER): Payer: Self-pay

## 2022-10-08 ENCOUNTER — Other Ambulatory Visit (HOSPITAL_BASED_OUTPATIENT_CLINIC_OR_DEPARTMENT_OTHER): Payer: Self-pay

## 2022-10-09 ENCOUNTER — Other Ambulatory Visit: Payer: Self-pay

## 2022-10-09 ENCOUNTER — Other Ambulatory Visit (HOSPITAL_BASED_OUTPATIENT_CLINIC_OR_DEPARTMENT_OTHER): Payer: Self-pay

## 2022-10-13 ENCOUNTER — Other Ambulatory Visit (HOSPITAL_BASED_OUTPATIENT_CLINIC_OR_DEPARTMENT_OTHER): Payer: Self-pay

## 2022-10-17 ENCOUNTER — Encounter (HOSPITAL_BASED_OUTPATIENT_CLINIC_OR_DEPARTMENT_OTHER): Payer: Self-pay | Admitting: Emergency Medicine

## 2022-10-17 ENCOUNTER — Emergency Department (HOSPITAL_BASED_OUTPATIENT_CLINIC_OR_DEPARTMENT_OTHER)
Admission: EM | Admit: 2022-10-17 | Discharge: 2022-10-18 | Disposition: A | Discharge status: Home / Self Care | Payer: Commercial Managed Care - PPO | Attending: Emergency Medicine | Admitting: Emergency Medicine

## 2022-10-17 ENCOUNTER — Emergency Department (HOSPITAL_BASED_OUTPATIENT_CLINIC_OR_DEPARTMENT_OTHER): Payer: Commercial Managed Care - PPO

## 2022-10-17 DIAGNOSIS — E119 Type 2 diabetes mellitus without complications: Secondary | ICD-10-CM | POA: Insufficient documentation

## 2022-10-17 DIAGNOSIS — R002 Palpitations: Secondary | ICD-10-CM | POA: Diagnosis not present

## 2022-10-17 DIAGNOSIS — E039 Hypothyroidism, unspecified: Secondary | ICD-10-CM | POA: Diagnosis not present

## 2022-10-17 LAB — CBC WITH DIFFERENTIAL/PLATELET
Abs Immature Granulocytes: 0.03 10*3/uL (ref 0.00–0.07)
Basophils Absolute: 0 10*3/uL (ref 0.0–0.1)
Basophils Relative: 0 %
Eosinophils Absolute: 0.2 10*3/uL (ref 0.0–0.5)
Eosinophils Relative: 3 %
HCT: 34.1 % — ABNORMAL LOW (ref 36.0–46.0)
Hemoglobin: 11.2 g/dL — ABNORMAL LOW (ref 12.0–15.0)
Immature Granulocytes: 0 %
Lymphocytes Relative: 31 %
Lymphs Abs: 2.1 10*3/uL (ref 0.7–4.0)
MCH: 31.6 pg (ref 26.0–34.0)
MCHC: 32.8 g/dL (ref 30.0–36.0)
MCV: 96.3 fL (ref 80.0–100.0)
Monocytes Absolute: 0.4 10*3/uL (ref 0.1–1.0)
Monocytes Relative: 6 %
Neutro Abs: 4.1 10*3/uL (ref 1.7–7.7)
Neutrophils Relative %: 60 %
Platelets: 208 10*3/uL (ref 150–400)
RBC: 3.54 MIL/uL — ABNORMAL LOW (ref 3.87–5.11)
RDW: 13.6 % (ref 11.5–15.5)
WBC: 6.8 10*3/uL (ref 4.0–10.5)
nRBC: 0 % (ref 0.0–0.2)

## 2022-10-17 LAB — COMPREHENSIVE METABOLIC PANEL
ALT: 21 U/L (ref 0–44)
AST: 23 U/L (ref 15–41)
Albumin: 4 g/dL (ref 3.5–5.0)
Alkaline Phosphatase: 54 U/L (ref 38–126)
Anion gap: 11 (ref 5–15)
BUN: 14 mg/dL (ref 8–23)
CO2: 26 mmol/L (ref 22–32)
Calcium: 9 mg/dL (ref 8.9–10.3)
Chloride: 101 mmol/L (ref 98–111)
Creatinine, Ser: 0.97 mg/dL (ref 0.44–1.00)
GFR, Estimated: >60 mL/min (ref >60)
Glucose, Bld: 94 mg/dL (ref 70–99)
Potassium: 3.6 mmol/L (ref 3.5–5.1)
Sodium: 138 mmol/L (ref 135–145)
Total Bilirubin: 0.5 mg/dL (ref 0.3–1.2)
Total Protein: 8.1 g/dL (ref 6.5–8.1)

## 2022-10-17 LAB — TSH: TSH: 6.883 u[IU]/mL — ABNORMAL HIGH (ref 0.350–4.500)

## 2022-10-17 LAB — TROPONIN I (HIGH SENSITIVITY): Troponin I (High Sensitivity): 5 ng/L (ref <18)

## 2022-10-17 LAB — BRAIN NATRIURETIC PEPTIDE: B Natriuretic Peptide: 52.9 pg/mL (ref 0.0–100.0)

## 2022-10-17 LAB — MAGNESIUM: Magnesium: 2.1 mg/dL (ref 1.7–2.4)

## 2022-10-17 NOTE — ED Notes (Signed)
Pt on monitor, EDP at bedside.

## 2022-10-17 NOTE — ED Provider Notes (Signed)
Emergency Department Provider Note   I have reviewed the triage vital signs and the nursing notes.   HISTORY  Chief Complaint Palpitations   HPI Janet Phelps is a 67 y.o. female with PMH of hypothyroidism and DM presents emergency department with worsening palpitations this evening.  She has had intermittent symptoms over the past several weeks to months.  She follows with a cardiology group in the [facility] system.  They have been titrating down her metoprolol due to bradycardia issues and some occasional lightheadedness.  She notes episodes where she will feel lightheaded and then very warm in her head and face along with palpitations.  Tonight, she began work and felt the familiar lightheadedness along with a warm feeling but symptoms seem more severe which prompted her to seek ED evaluation.  Is any specific chest pain/pressure/tightness.  No syncope. No SOB. She tried to reach her Cardiology group earlier in the week with more frequent symptoms but no appointment is available until late June. She is interested in switching care to the [facility] system.   Past Medical History:  Diagnosis Date   Diabetes mellitus (HCC)    Sciatica     Review of Systems  Constitutional: No fever/chills Eyes: No visual changes. ENT: No sore throat. Cardiovascular: Denies chest pain. Positive palpitations and near syncope.  Respiratory: Denies shortness of breath. Gastrointestinal: No abdominal pain.  No nausea, no vomiting.  No diarrhea.  No constipation. Genitourinary: Negative for dysuria. Musculoskeletal: Negative for back pain. Skin: Negative for rash. Neurological: Negative for headaches, focal weakness or numbness.  ____________________________________________   PHYSICAL EXAM:  VITAL SIGNS: ED Triage Vitals  Enc Vitals Group     BP 10/17/22 2027 (!) 171/96     Pulse Rate 10/17/22 2027 73     Resp 10/17/22 2027 20     Temp 10/17/22 2027 (!) 97.5 F (36.4 C)     Temp src --       SpO2 10/17/22 2027 99 %     Weight 10/17/22 2025 220 lb (99.8 kg)     Height 10/17/22 2025 5\' 5"  (1.651 m)   Constitutional: Alert and oriented. Well appearing and in no acute distress. Eyes: Conjunctivae are normal.  Head: Atraumatic. Nose: No congestion/rhinnorhea. Mouth/Throat: Mucous membranes are moist.  Neck: No stridor.   Cardiovascular: Normal rate, regular rhythm. Good peripheral circulation. Grossly normal heart sounds.   Respiratory: Normal respiratory effort.  No retractions. Lungs CTAB. Gastrointestinal: No distention.  Musculoskeletal: No gross deformities of extremities. Neurologic:  Normal speech and language.  Skin:  Skin is warm, dry and intact. No rash noted.   ____________________________________________   LABS (all labs ordered are listed, but only abnormal results are displayed)  Labs Reviewed  CBC WITH DIFFERENTIAL/PLATELET - Abnormal; Notable for the following components:      Result Value   RBC 3.54 (*)    Hemoglobin 11.2 (*)    HCT 34.1 (*)    All other components within normal limits  TSH - Abnormal; Notable for the following components:   TSH 6.883 (*)    All other components within normal limits  COMPREHENSIVE METABOLIC PANEL  MAGNESIUM  BRAIN NATRIURETIC PEPTIDE  TROPONIN I (HIGH SENSITIVITY)  TROPONIN I (HIGH SENSITIVITY)   ____________________________________________  EKG   EKG Interpretation  Date/Time:  Saturday Oct 17 2022 20:23:56 EDT Ventricular Rate:  74 PR Interval:  152 QRS Duration: 104 QT Interval:  387 QTC Calculation: 430 R Axis:   40 Text Interpretation: Sinus rhythm  Borderline T abnormalities, inferior leads no prior ECG for comparison. No STEMI Confirmed by Theda Belfast ([id number]) on 10/17/2022 8:26:25 PM        ____________________________________________  RADIOLOGY  DG Chest 2 View  Result Date: 10/17/2022 CLINICAL DATA:  Cardiac palpitations EXAM: CHEST - 2 VIEW COMPARISON:  08/03/2015 FINDINGS: The  heart size and mediastinal contours are within normal limits. Both lungs are clear. The visualized skeletal structures are unremarkable. IMPRESSION: No active cardiopulmonary disease. Electronically Signed   By: Alcide Clever M.D.   On: 10/17/2022 21:42    ____________________________________________   PROCEDURES  Procedure(s) performed:   Procedures  None  ____________________________________________   INITIAL IMPRESSION / ASSESSMENT AND PLAN / ED COURSE  Pertinent labs & imaging results that were available during my care of the patient were reviewed by me and considered in my medical decision making (see chart for details).   This patient is Presenting for Evaluation of palpitaitons, which does require a range of treatment options, and is a complaint that involves a high risk of morbidity and mortality.  The Differential Diagnoses includes but is not exclusive to acute coronary syndrome, aortic dissection, pulmonary embolism, cardiac tamponade, community-acquired pneumonia, pericarditis, musculoskeletal chest wall pain, etc.   Clinical Laboratory Tests Ordered, included troponin negative x 2.  Mild elevated TSH.  Normal electrolytes including potassium and magnesium.  BNP normal.  Radiologic Tests Ordered, included CXR. I independently interpreted the images and agree with radiology interpretation.   Cardiac Monitor Tracing which shows sinus rhythm.    Medical Decision Making: Summary:  Presents emergency department for evaluation of palpitations worsening this evening with some near syncope.  Troponin negative x 2.  Sinus rhythm here on telemetry monitoring in the ED.  She has increased her dose of metoprolol from 25 twice daily to 50 twice daily and this does not have severe bradycardia or low blood pressures.  Would have her continue this and plan to place referral in our system to follow with outpatient cardiology to consider ambulatory monitoring and further outpatient workup for  palpitations.   Considered admission but ED workup is reassuring. Referral placed for Cardiology.   Patient's presentation is most consistent with acute presentation with potential threat to life or bodily function.   Disposition: discharge  ____________________________________________  FINAL CLINICAL IMPRESSION(S) / ED DIAGNOSES  Final diagnoses:  Palpitations    Note:  This document was prepared using Dragon voice recognition software and may include unintentional dictation errors.  Alona Bene, MD, [facility]    Irva Loser, Arlyss Repress, MD 10/18/22 [phone]

## 2022-10-17 NOTE — ED Triage Notes (Signed)
Reports BP medication changes in March, and palpitations ever since. She states its been more frequent in the last few days. Pt reports her son died at 31 in his sleep and didn't know he had an enlarged heart. Today she reports feeling very lightheaded with palpitations while driving home from work. She reports that has resolved now.

## 2022-10-18 LAB — TROPONIN I (HIGH SENSITIVITY): Troponin I (High Sensitivity): 5 ng/L (ref <18)

## 2022-10-18 NOTE — ED Notes (Signed)
Discharge instructions reviewed with patient. Patient questions answered and opportunity for education reviewed. Patient voices understanding of discharge instructions with no further questions. Patient ambulatory with steady gait to lobby.  

## 2022-10-18 NOTE — Discharge Instructions (Signed)
Your workup here in the emergency department is reassuring but I have placed a referral to our cardiology service.  They may consider having you wear an ambulatory monitor to try and catch 1 of these episodes and identify a treatment option.  I agree with going back to your 50 mg metoprolol twice daily.  Return to the emergency department with any new or suddenly worsening symptoms.

## 2022-10-19 ENCOUNTER — Other Ambulatory Visit (HOSPITAL_BASED_OUTPATIENT_CLINIC_OR_DEPARTMENT_OTHER): Payer: Self-pay

## 2022-10-20 ENCOUNTER — Other Ambulatory Visit: Payer: Self-pay

## 2022-10-20 ENCOUNTER — Other Ambulatory Visit (HOSPITAL_BASED_OUTPATIENT_CLINIC_OR_DEPARTMENT_OTHER): Payer: Self-pay

## 2022-10-21 ENCOUNTER — Other Ambulatory Visit: Payer: Self-pay

## 2022-10-21 ENCOUNTER — Other Ambulatory Visit (HOSPITAL_BASED_OUTPATIENT_CLINIC_OR_DEPARTMENT_OTHER): Payer: Self-pay

## 2022-10-21 DIAGNOSIS — E039 Hypothyroidism, unspecified: Secondary | ICD-10-CM | POA: Diagnosis not present

## 2022-10-21 DIAGNOSIS — R7303 Prediabetes: Secondary | ICD-10-CM | POA: Diagnosis not present

## 2022-10-21 DIAGNOSIS — I10 Essential (primary) hypertension: Secondary | ICD-10-CM | POA: Diagnosis not present

## 2022-10-21 DIAGNOSIS — E782 Mixed hyperlipidemia: Secondary | ICD-10-CM | POA: Diagnosis not present

## 2022-10-21 DIAGNOSIS — D649 Anemia, unspecified: Secondary | ICD-10-CM | POA: Diagnosis not present

## 2022-10-21 DIAGNOSIS — K219 Gastro-esophageal reflux disease without esophagitis: Secondary | ICD-10-CM | POA: Diagnosis not present

## 2022-10-21 DIAGNOSIS — R002 Palpitations: Secondary | ICD-10-CM | POA: Diagnosis not present

## 2022-10-22 ENCOUNTER — Other Ambulatory Visit (HOSPITAL_BASED_OUTPATIENT_CLINIC_OR_DEPARTMENT_OTHER): Payer: Self-pay

## 2022-10-28 ENCOUNTER — Ambulatory Visit: Payer: Commercial Managed Care - PPO | Attending: Cardiology

## 2022-10-28 ENCOUNTER — Ambulatory Visit: Payer: Commercial Managed Care - PPO | Attending: Cardiology | Admitting: Cardiology

## 2022-10-28 ENCOUNTER — Encounter: Payer: Self-pay | Admitting: Cardiology

## 2022-10-28 VITALS — BP 126/82 | HR 62 | Ht 65.0 in | Wt 214.0 lb

## 2022-10-28 DIAGNOSIS — R002 Palpitations: Secondary | ICD-10-CM

## 2022-10-28 DIAGNOSIS — G4733 Obstructive sleep apnea (adult) (pediatric): Secondary | ICD-10-CM | POA: Diagnosis not present

## 2022-10-28 DIAGNOSIS — E7800 Pure hypercholesterolemia, unspecified: Secondary | ICD-10-CM | POA: Diagnosis not present

## 2022-10-28 DIAGNOSIS — I10 Essential (primary) hypertension: Secondary | ICD-10-CM

## 2022-10-28 NOTE — Progress Notes (Unsigned)
Cardiology Consultation:    Date:  10/28/2022   ID:  Janet Phelps, Janet Phelps 05-05-1956, MRN [id number]  PCP:  Jackie Plum, MD  Cardiologist:  Gypsy Balsam, MD   Referring MD: Jackie Plum, MD   Chief Complaint  Patient presents with   Med Management - DVT   Heart Eval    Palpitations    History of Present Illness:    Janet Phelps is a 67 y.o. female who is being seen today for the evaluation of palpitations at the request of Osei-Bonsu, Greggory Stallion, MD. he is a very sweet psychiatry nurse practitioner originally from [place] past medical history significant for heart murmur, essential hypertension, obstructive sleep apnea on CPAP mask dyslipidemia, diabetes.  She wanted to be seen because for last few months she has been experiencing palpitations she will feel her heart speeding up there is no dizziness no passing out with this sensation just simply sensation of palpitations.  She is convinced that the reason for that palpitation is the fact that she was switched from lisinopril/hydrochlorothiazide to losartan hydrochlorothiazide.  However, there is much more to the story.  Tragedy strikes, few months ago her son who is orthopedic surgeon died suddenly autopsy was done and he was found to have cardiomegaly.  Then her husband shortly after that process well obviously he is still very shaken by that she is here to be checked make sure there is no problem with her heart and make sure she does not have the same fate like her husband and son.  Interestingly yesterday she was walking and developed dizziness almost to the point of passing out she had blood pressure checked was a very low 90 systolic.  She is thinks that she may take too much of blood pressure medications.  She does not exercise on the regular basis.  She is not on any special diet  Past Medical History:  Diagnosis Date   Abnormal blood chemistry 07/18/2013   Abnormal urine 01/04/2017   Anemia  05/09/2018   Chronic RLQ pain 01/04/2017   Costovertebral angle tenderness 01/04/2017   Diabetes mellitus (HCC)    History of gestational diabetes 05/09/2012   GDM 1 pregnancy on  insulin   History of Helicobacter pylori infection 06/25/2017   Hypercholesterolemia 07/18/2013   Hypokalemia 02/28/2014   Insomnia 07/18/2013   Mastodynia 06/06/2013   Nausea 06/04/2015   Newly recognized heart murmur 12/22/2013   Other benign mammary dysplasias of right breast 01/14/2015   Right flank pain 01/04/2017   Sciatica    Screening for colon cancer 01/14/2015   Seasonal allergies 06/04/2015   Sensation of lump in throat 06/04/2015   Severe obesity (BMI 35.0-39.9) with comorbidity (HCC) 06/25/2017   Snoring 12/23/2016   Vaginal pain 01/14/2015   Vitamin D deficiency 07/23/2017    Past Surgical History:  Procedure Laterality Date   CESAREAN SECTION     x3   CHOLECYSTECTOMY     COLPOSCOPY     LUMBAR LAMINECTOMY     TONSILLECTOMY      Current Medications: Current Meds  Medication Sig   Ascorbic Acid (VITAMIN C PO) Take 1 tablet by mouth daily.   aspirin 81 MG tablet Take 81 mg by mouth 2 (two) times a week.   B Complex Vitamins (VITAMIN-B COMPLEX PO) Take 1 tablet by mouth daily.   COVID-19 mRNA vaccine 2023-2024 (COMIRNATY) syringe Inject into the muscle. (Patient taking differently: Inject 0.3 mLs into the muscle once.)   influenza vaccine adjuvanted (FLUAD QUADRIVALENT)  0.5 ML injection Inject into the muscle. (Patient taking differently: Inject 0.5 mLs into the muscle once.)   levothyroxine (SYNTHROID) 50 MCG tablet Take 1 tablet (50 mcg total) by mouth daily.   losartan-hydrochlorothiazide (HYZAAR) 50-12.5 MG tablet Take 1 tablet by mouth daily.   Magnesium 400 MG TABS Take 1 tablet by mouth daily.   metoprolol tartrate (LOPRESSOR) 50 MG tablet Take 50 mg by mouth 2 (two) times daily.   Multiple Vitamin (MULTIVITAMIN) capsule Take 1 capsule by mouth daily.   Potassium Chloride ER  20 MEQ TBCR Take 1 tablet (20 mEq total) by mouth 2 (two) times daily.   spironolactone (ALDACTONE) 25 MG tablet Take 1 tablet (25 mg total) by mouth daily.   tiZANidine (ZANAFLEX) 4 MG tablet Take 1 tablet (4 mg total) by mouth every 6 (six) to 8 (eight) hours as needed, not to exceed 3 doses in 24 hours. (Patient taking differently: Take 4 mg by mouth every 8 (eight) hours as needed for muscle spasms.)   [DISCONTINUED] amLODipine (NORVASC) 2.5 MG tablet Take 1 tablet by mouth once daily (Patient taking differently: Take 2.5 mg by mouth daily.)   [DISCONTINUED] amLODipine (NORVASC) 2.5 MG tablet Take 1 tablet by mouth once a day (Patient taking differently: Take 2.5 mg by mouth daily.)   [DISCONTINUED] amLODipine-benazepril (LOTREL) 5-20 MG capsule Take 1 capsule by mouth daily.   [DISCONTINUED] amLODipine-olmesartan (AZOR) 5-20 MG tablet Take 1 tablet by mouth daily.   [DISCONTINUED] ciprofloxacin (CIPRO) 500 MG tablet Take 1 tablet (500 mg total) by mouth every 12 (twelve) hours for 10 days.   [DISCONTINUED] lansoprazole (PREVACID) 30 MG capsule Take 30 mg by mouth daily.   [DISCONTINUED] lansoprazole (PREVACID) 30 MG capsule Take 1 capsule (30 mg total) by mouth daily.   [DISCONTINUED] levothyroxine (SYNTHROID) 50 MCG tablet Take 50 mcg by mouth daily.   [DISCONTINUED] losartan-hydrochlorothiazide (HYZAAR) 50-12.5 MG tablet Take 1 tablet by mouth daily.   [DISCONTINUED] metoprolol succinate (TOPROL-XL) 25 MG 24 hr tablet Take 1 tablet (25 mg total) by mouth daily.   [DISCONTINUED] metoprolol tartrate (LOPRESSOR) 50 MG tablet Take 1 tablet (50 mg total) by mouth 2 (two) times daily.   [DISCONTINUED] metoprolol tartrate (LOPRESSOR) 50 MG tablet Take 1 tablet (50 mg total) by mouth 2 (two) times daily.   [DISCONTINUED] nitrofurantoin, macrocrystal-monohydrate, (MACROBID) 100 MG capsule Take 1 capsule (100 mg total) by mouth 2 (two) times daily for 7 days.   [DISCONTINUED] spironolactone (ALDACTONE)  25 MG tablet Take 1 tablet by mouth once a day   [DISCONTINUED] tiZANidine (ZANAFLEX) 4 MG tablet Take 1 tablet (4 mg total) by mouth every 8 (eight) hours as needed for 10 days.     Allergies:   Labetalol and Benazepril   Social History   Socioeconomic History   Marital status: Single    Spouse name: Not on file   Number of children: 4   Years of education: Not on file   Highest education level: Not on file  Occupational History   Not on file  Tobacco Use   Smoking status: Never   Smokeless tobacco: Never  Vaping Use   Vaping Use: Never used  Substance and Sexual Activity   Alcohol use: Yes    Comment: OCC   Drug use: No   Sexual activity: Never  Other Topics Concern   Not on file  Social History Narrative   Not on file   Social Determinants of Health   Financial Resource Strain: Not on file  Food Insecurity: Not on file  Transportation Needs: Not on file  Physical Activity: Not on file  Stress: Not on file  Social Connections: Not on file     Family History: The patient's family history includes Hypertension in her mother, sister, sister, and sister; Stroke in her mother; Thyroid disease in her mother. ROS:   Please see the history of present illness.    All 14 point review of systems negative except as described per history of present illness.  EKGs/Labs/Other Studies Reviewed:    The following studies were reviewed today:   EKG:  EKG is  ordered today.  The ekg ordered today demonstrates sinus bradycardia, normal P interval normal QS complex duration morphology, nonspecific ST segment changes  Recent Labs: 10/17/2022: ALT 21; B Natriuretic Peptide 52.9; BUN 14; Creatinine, Ser 0.97; Hemoglobin 11.2; Magnesium 2.1; Platelets 208; Potassium 3.6; Sodium 138; TSH 6.883  Recent Lipid Panel No results found for: "CHOL", "TRIG", "HDL", "CHOLHDL", "VLDL", "LDLCALC", "LDLDIRECT"  Physical Exam:    VS:  BP 126/82 (BP Location: Left Arm, Patient Position: Sitting)    Pulse 62   Ht 5\' 5"  (1.651 m)   Wt 214 lb (97.1 kg)   SpO2 96%   BMI 35.61 kg/m     Wt Readings from Last 3 Encounters:  10/28/22 214 lb (97.1 kg)  10/17/22 220 lb (99.8 kg)  12/06/21 225 lb (102.1 kg)     GEN:  Well nourished, well developed in no acute distress HEENT: Normal NECK: No JVD; No carotid bruits LYMPHATICS: No lymphadenopathy CARDIAC: RRR, no murmurs, no rubs, no gallops RESPIRATORY:  Clear to auscultation without rales, wheezing or rhonchi  ABDOMEN: Soft, non-tender, non-distended MUSCULOSKELETAL:  No edema; No deformity  SKIN: Warm and dry NEUROLOGIC:  Alert and oriented x 3 PSYCHIATRIC:  Normal affect   ASSESSMENT:    1. Palpitations   2. Hypercholesterolemia   3. Primary hypertension   4. OSA on CPAP    PLAN:    In order of problems listed above:  Palpitations.  I will ask her to wear Zio patch to see exactly what can of arrhythmia with dealing with echocardiogram will be done as well to assess left ventricle ejection fraction especially in view of the family history of sudden cardiac death most likely arrhythmia.  Her son who was orthopedic surgeon who passed recently had however history of ASD and he did have surgery on young age.  After that apparently he did have atrial flutter and required ablation but then he was doing great and simply 1 day did not come out for the work and then police were summoned and he was find that in his apartment.  Obviously tragedy.  Will do echocardiogram make sure were not dealing with Takotsubo. Hypercholesterolemia will continue workup to decide which way to approach this problem. Essential hypertension I have a feeling she may be taking too much of medications.  She did not take her losartan hydrochlorothiazide today.  I asked if her blood pressure is elevated she did take half of this dose.  And I reminded the future to switch her back to lisinopril/record thiazide.  The reason why apparently her lisinopril  hydrochlorothiazide has been discontinued the fact the doctor was concerned that she may develop angioedema even though there is no indications for this.   Medication Adjustments/Labs and Tests Ordered: Current medicines are reviewed at length with the patient today.  Concerns regarding medicines are outlined above.  Orders Placed This Encounter  Procedures  LONG TERM MONITOR (3-14 DAYS)   EKG 12-Lead   ECHOCARDIOGRAM COMPLETE   No orders of the defined types were placed in this encounter.   Signed, Georgeanna Lea, MD, [facility]. 10/28/2022 5:04 PM    [facility]

## 2022-10-28 NOTE — Patient Instructions (Addendum)
Medication Instructions:  DECREASE: losartan-hydrochlorothiazide (HYZAAR) 50-12.5 MG 1/2 tablet daily   Lab Work: None Ordered If you have labs (blood work) drawn today and your tests are completely normal, you will receive your results only by: MyChart Message (if you have MyChart) OR A paper copy in the mail If you have any lab test that is abnormal or we need to change your treatment, we will call you to review the results.   Testing/Procedures: Your physician has requested that you have an echocardiogram. Echocardiography is a painless test that uses sound waves to create images of your heart. It provides your doctor with information about the size and shape of your heart and how well your heart's chambers and valves are working. This procedure takes approximately one hour. There are no restrictions for this procedure. Please do NOT wear cologne, perfume, aftershave, or lotions (deodorant is allowed). Please arrive 15 minutes prior to your appointment time.    WHY IS MY DOCTOR PRESCRIBING ZIO? The Zio system is proven and trusted by physicians to detect and diagnose irregular heart rhythms -- and has been prescribed to hundreds of thousands of patients.  The FDA has cleared the Zio system to monitor for many different kinds of irregular heart rhythms. In a study, physicians were able to reach a diagnosis 90% of the time with the Zio system1.  You can wear the Zio monitor -- a small, discreet, comfortable patch -- during your normal day-to-day activity, including while you sleep, shower, and exercise, while it records every single heartbeat for analysis.  1Barrett, P., et al. Comparison of 24 Hour Holter Monitoring Versus 14 Day Novel Adhesive Patch Electrocardiographic Monitoring. American Journal of Medicine, 2014.  ZIO VS. HOLTER MONITORING The Zio monitor can be comfortably worn for up to 14 days. Holter monitors can be worn for 24 to 48 hours, limiting the time to record any  irregular heart rhythms you may have. Zio is able to capture data for the 51% of patients who have their first symptom-triggered arrhythmia after 48 hours.1  LIVE WITHOUT RESTRICTIONS The Zio ambulatory cardiac monitor is a small, unobtrusive, and water-resistant patch--you might even forget you're wearing it. The Zio monitor records and stores every beat of your heart, whether you're sleeping, working out, or showering.     Follow-Up: At [facility], you and your health needs are our priority.  As part of our continuing mission to provide you with exceptional heart care, we have created designated Provider Care Teams.  These Care Teams include your primary Cardiologist (physician) and Advanced Practice Providers (APPs -  Physician Assistants and Nurse Practitioners) who all work together to provide you with the care you need, when you need it.  We recommend signing up for the patient portal called "MyChart".  Sign up information is provided on this After Visit Summary.  MyChart is used to connect with patients for Virtual Visits (Telemedicine).  Patients are able to view lab/test results, encounter notes, upcoming appointments, etc.  Non-urgent messages can be sent to your provider as well.   To learn more about what you can do with MyChart, go to [email or web address].    Your next appointment:   6 week(s)  The format for your next appointment:   In Person  Provider:   Gypsy Balsam, MD    Other Instructions NA

## 2022-11-04 ENCOUNTER — Other Ambulatory Visit: Payer: Self-pay

## 2022-11-04 ENCOUNTER — Other Ambulatory Visit (HOSPITAL_BASED_OUTPATIENT_CLINIC_OR_DEPARTMENT_OTHER): Payer: Self-pay

## 2022-11-04 MED ORDER — LISINOPRIL-HYDROCHLOROTHIAZIDE 20-12.5 MG PO TABS
1.0000 | ORAL_TABLET | Freq: Every day | ORAL | 1 refills | Status: DC
  Filled 2022-11-04: qty 90, 90d supply, fill #0
  Filled 2023-01-27: qty 90, 90d supply, fill #1

## 2022-11-05 ENCOUNTER — Other Ambulatory Visit (HOSPITAL_BASED_OUTPATIENT_CLINIC_OR_DEPARTMENT_OTHER): Payer: Self-pay

## 2022-11-13 DIAGNOSIS — G4733 Obstructive sleep apnea (adult) (pediatric): Secondary | ICD-10-CM | POA: Diagnosis not present

## 2022-11-16 ENCOUNTER — Other Ambulatory Visit (HOSPITAL_BASED_OUTPATIENT_CLINIC_OR_DEPARTMENT_OTHER): Payer: Self-pay

## 2022-11-16 ENCOUNTER — Other Ambulatory Visit: Payer: Self-pay

## 2022-11-17 ENCOUNTER — Other Ambulatory Visit: Payer: Self-pay | Admitting: Cardiology

## 2022-11-17 DIAGNOSIS — R002 Palpitations: Secondary | ICD-10-CM

## 2022-11-17 DIAGNOSIS — E7800 Pure hypercholesterolemia, unspecified: Secondary | ICD-10-CM

## 2022-11-17 DIAGNOSIS — I10 Essential (primary) hypertension: Secondary | ICD-10-CM

## 2022-11-17 DIAGNOSIS — G4733 Obstructive sleep apnea (adult) (pediatric): Secondary | ICD-10-CM

## 2022-11-17 DIAGNOSIS — R0602 Shortness of breath: Secondary | ICD-10-CM

## 2022-11-18 ENCOUNTER — Other Ambulatory Visit: Payer: Commercial Managed Care - PPO

## 2022-11-18 ENCOUNTER — Telehealth: Payer: Self-pay

## 2022-11-18 ENCOUNTER — Other Ambulatory Visit (HOSPITAL_BASED_OUTPATIENT_CLINIC_OR_DEPARTMENT_OTHER): Payer: Self-pay

## 2022-11-18 ENCOUNTER — Ambulatory Visit (HOSPITAL_COMMUNITY)
Admission: RE | Admit: 2022-11-18 | Discharge: 2022-11-18 | Disposition: A | Discharge status: Home / Self Care | Payer: Commercial Managed Care - PPO | Source: Ambulatory Visit | Attending: Cardiology | Admitting: Cardiology

## 2022-11-18 DIAGNOSIS — R002 Palpitations: Secondary | ICD-10-CM | POA: Diagnosis not present

## 2022-11-18 DIAGNOSIS — R0602 Shortness of breath: Secondary | ICD-10-CM | POA: Insufficient documentation

## 2022-11-18 DIAGNOSIS — I080 Rheumatic disorders of both mitral and aortic valves: Secondary | ICD-10-CM

## 2022-11-18 DIAGNOSIS — I10 Essential (primary) hypertension: Secondary | ICD-10-CM | POA: Diagnosis not present

## 2022-11-18 DIAGNOSIS — I517 Cardiomegaly: Secondary | ICD-10-CM | POA: Diagnosis not present

## 2022-11-18 DIAGNOSIS — I5030 Unspecified diastolic (congestive) heart failure: Secondary | ICD-10-CM | POA: Diagnosis not present

## 2022-11-18 DIAGNOSIS — E785 Hyperlipidemia, unspecified: Secondary | ICD-10-CM | POA: Diagnosis not present

## 2022-11-18 LAB — ECHOCARDIOGRAM COMPLETE
AR max vel: 1.62 cm2
AV Area VTI: 1.72 cm2
AV Area mean vel: 1.54 cm2
AV Mean grad: 6 mmHg
AV Peak grad: 10.8 mmHg
Ao pk vel: 1.65 m/s
Area-P 1/2: 3.99 cm2
Calc EF: 61.2 %
MV M vel: 3.64 m/s
MV Peak grad: 53 mmHg
S' Lateral: 3.3 cm
Single Plane A2C EF: 61.2 %
Single Plane A4C EF: 65.7 %

## 2022-11-18 NOTE — Telephone Encounter (Signed)
Patient came in stating that while she was watching TV this past weekend both of her arms went numb so she took 4 baby asprin and it helped. She took 4 more the following day and she's been taking 1 once a day since. She said she's not having any chest pain but the numbness in her arms are worrying her. She had an echo done today in [place] and she has an appointment in July that she thinks needs to be moved up. If someone could reach out to her and speak with her about her symptoms. Thank you CB # [phone]

## 2022-11-18 NOTE — Telephone Encounter (Signed)
Spoke with pt who reports numbness in both arms. Denies chest pain. Advised best to see PCP for sx. Pt is not having sx at this time. Discussed the importance of calling 911 and going to the ED for return of sx as she could be having a stroke. FAST reviewed. Pt verbalized understanding and had no additional questions.

## 2022-11-19 ENCOUNTER — Other Ambulatory Visit (HOSPITAL_BASED_OUTPATIENT_CLINIC_OR_DEPARTMENT_OTHER): Payer: Self-pay

## 2022-11-23 ENCOUNTER — Other Ambulatory Visit (HOSPITAL_BASED_OUTPATIENT_CLINIC_OR_DEPARTMENT_OTHER): Payer: Self-pay

## 2022-11-23 ENCOUNTER — Telehealth: Payer: Self-pay

## 2022-11-23 DIAGNOSIS — E7800 Pure hypercholesterolemia, unspecified: Secondary | ICD-10-CM

## 2022-11-23 DIAGNOSIS — R0602 Shortness of breath: Secondary | ICD-10-CM

## 2022-11-23 NOTE — Telephone Encounter (Signed)
-----   Message from Georgeanna Lea, MD sent at 11/19/2022  4:59 PM EDT ----- Echocardiogram showed preserved left ventricle ejection fraction, diastolic dysfunction, mild mitral valve regurgitation,

## 2022-11-23 NOTE — Telephone Encounter (Signed)
Patient notified through my chart.

## 2022-11-25 ENCOUNTER — Ambulatory Visit (HOSPITAL_BASED_OUTPATIENT_CLINIC_OR_DEPARTMENT_OTHER): Payer: Commercial Managed Care - PPO

## 2022-11-27 ENCOUNTER — Telehealth: Payer: Self-pay | Admitting: Gastroenterology

## 2022-11-27 NOTE — Telephone Encounter (Addendum)
Hi Dr. Adela Lank,   Supervising Provider:    Patient called requesting a transfer of care from [facility] due to them not being par with the insurance. The patient stated she did not have any issues with their office but would need to seek care within her plan, She needs to be evaluated for upper right quad discomfort and having issues with eating. The patients records were obtained and scanned into Media for you to review.  Please advise on scheduling.  Thanks you

## 2022-11-27 NOTE — Addendum Note (Signed)
Addended by: Heywood Bene on: 11/27/2022 03:18 PM   Modules accepted: Orders

## 2022-11-27 NOTE — Telephone Encounter (Signed)
Okay, we can see her in the office for new patient visit.

## 2022-12-04 ENCOUNTER — Encounter: Payer: Self-pay | Admitting: Nurse Practitioner

## 2022-12-04 ENCOUNTER — Ambulatory Visit: Payer: Commercial Managed Care - PPO | Admitting: Nurse Practitioner

## 2022-12-04 ENCOUNTER — Other Ambulatory Visit (INDEPENDENT_AMBULATORY_CARE_PROVIDER_SITE_OTHER): Payer: Commercial Managed Care - PPO

## 2022-12-04 ENCOUNTER — Other Ambulatory Visit (HOSPITAL_BASED_OUTPATIENT_CLINIC_OR_DEPARTMENT_OTHER): Payer: Self-pay

## 2022-12-04 VITALS — BP 126/68 | HR 69 | Ht 65.0 in | Wt 213.0 lb

## 2022-12-04 DIAGNOSIS — Z8619 Personal history of other infectious and parasitic diseases: Secondary | ICD-10-CM | POA: Diagnosis not present

## 2022-12-04 DIAGNOSIS — R1011 Right upper quadrant pain: Secondary | ICD-10-CM

## 2022-12-04 DIAGNOSIS — D649 Anemia, unspecified: Secondary | ICD-10-CM | POA: Diagnosis not present

## 2022-12-04 LAB — COMPREHENSIVE METABOLIC PANEL
ALT: 11 U/L (ref 0–35)
AST: 17 U/L (ref 0–37)
Albumin: 4.2 g/dL (ref 3.5–5.2)
Alkaline Phosphatase: 54 U/L (ref 39–117)
BUN: 14 mg/dL (ref 6–23)
CO2: 30 mEq/L (ref 19–32)
Calcium: 9.8 mg/dL (ref 8.4–10.5)
Chloride: 102 mEq/L (ref 96–112)
Creatinine, Ser: 0.94 mg/dL (ref 0.40–1.20)
GFR: 63.47 mL/min (ref >60.00)
Glucose, Bld: 83 mg/dL (ref 70–99)
Potassium: 3.9 mEq/L (ref 3.5–5.1)
Sodium: 139 mEq/L (ref 135–145)
Total Bilirubin: 0.8 mg/dL (ref 0.2–1.2)
Total Protein: 7.7 g/dL (ref 6.0–8.3)

## 2022-12-04 LAB — CBC
HCT: 35.2 % — ABNORMAL LOW (ref 36.0–46.0)
Hemoglobin: 11.5 g/dL — ABNORMAL LOW (ref 12.0–15.0)
MCHC: 32.5 g/dL (ref 30.0–36.0)
MCV: 96.3 fl (ref 78.0–100.0)
Platelets: 214 10*3/uL (ref 150.0–400.0)
RBC: 3.66 Mil/uL — ABNORMAL LOW (ref 3.87–5.11)
RDW: 14.4 % (ref 11.5–15.5)
WBC: 5.5 10*3/uL (ref 4.0–10.5)

## 2022-12-04 LAB — IBC + FERRITIN
Ferritin: 23.1 ng/mL (ref 10.0–291.0)
Iron: 71 ug/dL (ref 42–145)
Saturation Ratios: 17.7 % — ABNORMAL LOW (ref 20.0–50.0)
TIBC: 400.4 ug/dL (ref 250.0–450.0)
Transferrin: 286 mg/dL (ref 212.0–360.0)

## 2022-12-04 MED ORDER — FAMOTIDINE 20 MG PO TABS
20.0000 mg | ORAL_TABLET | Freq: Every day | ORAL | 1 refills | Status: DC
  Filled 2022-12-04: qty 30, 30d supply, fill #0
  Filled 2023-01-01 – 2023-01-21 (×2): qty 30, 30d supply, fill #1

## 2022-12-04 NOTE — Progress Notes (Signed)
Agree with assessment and plan as outlined.  

## 2022-12-04 NOTE — Progress Notes (Signed)
12/04/2022 Janet Phelps [id number] 07-Feb-1956   CHIEF COMPLAINT: RUQ pain   HISTORY OF PRESENT ILLNESS: Kodi C. Arendall is a 67 year old female with a past medical history of hypertension, hyperlipidemia, sleep apnea, hypothyroidism, palpitations, prediabetes, GERD and colon polyps. S/P cholecystectomy secondary to gallstones 1990. She presents to our office today self-referred for further evaluation regarding RUQ pain.  She endorses having RUQ pain which radiates to the mid back for the past 6 months.  No associated nausea or vomiting.  Eating does not trigger this pain.  She describes her RUQ pain as nagging which comes and goes and lasts for 2 to 3 hours.  No heartburn.  Over the past few months, she has noticed having slight dysphagia which occurs sporadically.  She describes having difficulty swallowing foods such as hamburger which does not get stuck but she has to drink multiple sips of water for food to pass down the esophagus. Decreased appetite. She is attempting to lose weight with increased walking.  She developed palpitations, sees her left chest beating which started 1 or 2 months ago.  She recently noticed, her palpitations typically occur after eating.  No specific food triggers.  She is passing normal formed brown bowel movement most days as long as she takes a stool softener. She infrequently sees a streak of bright red blood on the toilet tissue or stool when she strains when significantly constipated. No black stools.  She previously took Prevacid in the past but was off of it for a while then restarted Prevacid one month ago then stopped it as she was unsure if it was contributing to her palpitations. She was also concerned her palpitations may have been triggered by starting losartan HCTZ.  She was evaluated by cardiologist Dr. Bing Matter 10/28/2022 for further evaluation regarding palpitations. At that time, twelve-lead EKG showed a sinus bradycardia with nonspecific  ST's segment changes.  Echo 11/18/2022 showed LVEF 60 to 65%, grade 2 diastolic dysfunction with mild MR.  Ziopatch study was negative for A-fib.  She is scheduled for a cardiac CT 12/09/2022.  He denies having chest pain or shortness of breath.  No palpitations at this time. She underwent laboratory studies April 2024 which she reported showed a hemoglobin level of 9.2.  She started taking OTC iron supplement but stopped taking it 8 days ago as she was not sure if it was triggering her RUQ discomfort.  She takes ASA 81 mg twice weekly for the past month.  No other NSAID use.  She underwent a colonoscopy 05/18/2018 by Dr. Oletha Blend with [facility] which showed a subcentimeter tubular adenomatous polyp that was removed.  She was advised to repeat a colonoscopy 05/2023.  History of H. pylori per EGD in 2018 treated with a course of Amoxicillin, Clarithromycin and Prevacid.  Posttreatment H. pylori stool antigen was negative.  No known family history of esophageal, gastric or colon cancer.  Her mother died at the age of 68 and she was informed her mother passed dark stool prior to her death.   Her insurance recently changed and her prior GI was no longer in network therefore she requested to transition her GI management with [facility].     Latest Ref Rng & Units 10/17/2022    8:48 PM 08/03/2015    8:47 AM  CBC  WBC 4.0 - 10.5 K/uL 6.8  4.3   Hemoglobin 12.0 - 15.0 g/dL 40.9  81.1   Hematocrit 36.0 - 46.0 % 34.1  38.7  Platelets 150 - 400 K/uL 208  145        Latest Ref Rng & Units 10/17/2022    8:48 PM 11/21/2021   12:20 PM 08/03/2015    8:47 AM  CMP  Glucose 70 - 99 mg/dL 94  161  096   BUN 8 - 23 mg/dL 14  21  10    Creatinine 0.44 - 1.00 mg/dL 0.45  4.09  8.11   Sodium 135 - 145 mmol/L 138  139  138   Potassium 3.5 - 5.1 mmol/L 3.6  4.7  3.3   Chloride 98 - 111 mmol/L 101  101  98   CO2 22 - 32 mmol/L 26  24  29    Calcium 8.9 - 10.3 mg/dL 9.0  9.6  8.8   Total Protein 6.5 - 8.1  g/dL 8.1  7.1  7.9   Total Bilirubin 0.3 - 1.2 mg/dL 0.5  0.6  1.0   Alkaline Phos 38 - 126 U/L 54  57  44   AST 15 - 41 U/L 23  17  44   ALT 0 - 44 U/L 21  13  28      TSH 6.83  ECHO 11/18/2022: IMPRESSIONS Left ventricular ejection fraction, by estimation, is 60 to 65%. Left ventricular ejection fraction by 3D volume is 63 %. The left ventricle has normal function. The left ventricle has no regional wall motion abnormalities. Left ventricular diastolic parameters are consistent with Grade II diastolic dysfunction (pseudonormalization). The average left ventricular global longitudinal strain is -19.0 %. The global longitudinal strain is normal. 1. Right ventricular systolic function is normal. The right ventricular size is normal. There is normal pulmonary artery systolic pressure. The estimated right ventricular systolic pressure is 16.4 mmHg. 2. 3. Left atrial size was mild to moderately dilated. The mitral valve is degenerative. Mild mitral valve regurgitation. No evidence of mitral stenosis. 4. The aortic valve is tricuspid. There is moderate calcification of the aortic valve. Aortic valve regurgitation is not visualized. Aortic valve sclerosis/calcification is present, without any evidence of aortic stenosis. Aortic valve area, by VTI measures 1.72 cm. Aortic valve mean gradient measures 6.0 mmHg. Aortic valve Vmax measures 1.64 m/s. 5. The inferior vena cava is normal in size with greater than 50% respiratory variability, suggesting right atrial pressure of 3 mmHg.  Past Medical History:  Diagnosis Date   Abnormal blood chemistry 07/18/2013   Abnormal urine 01/04/2017   Anemia 05/09/2018   Chronic RLQ pain 01/04/2017   Costovertebral angle tenderness 01/04/2017   Diabetes mellitus (HCC)    History of gestational diabetes 05/09/2012   GDM 1 pregnancy on  insulin   History of Helicobacter pylori infection 06/25/2017   Hypercholesterolemia 07/18/2013   Hypokalemia 02/28/2014   Insomnia  07/18/2013   Mastodynia 06/06/2013   Nausea 06/04/2015   Newly recognized heart murmur 12/22/2013   Other benign mammary dysplasias of right breast 01/14/2015   Right flank pain 01/04/2017   Sciatica    Screening for colon cancer 01/14/2015   Seasonal allergies 06/04/2015   Sensation of lump in throat 06/04/2015   Severe obesity (BMI 35.0-39.9) with comorbidity (HCC) 06/25/2017   Snoring 12/23/2016   Vaginal pain 01/14/2015   Vitamin D deficiency 07/23/2017   Past Surgical History:  Procedure Laterality Date   CESAREAN SECTION     x3   CHOLECYSTECTOMY     COLPOSCOPY     LUMBAR LAMINECTOMY     TONSILLECTOMY     Social History: She is originally  [place].  She is single.  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  Non-smoker.  She drinks 1 alcoholic beverage once yearly.  No drug use.  Family History: Mother with history of thyroid disease, hypertension and CVA.  No known family history of esophageal, gastric, colon or liver cancer.  Allergies  Allergen Reactions   Labetalol Itching    Scalp itching, hard to breathe, feel stuffy Scalp itching, hard to breathe, feel stuffy    Benazepril Cough      Outpatient Encounter Medications as of 12/04/2022  Medication Sig   Ascorbic Acid (VITAMIN C PO) Take 1 tablet by mouth daily.   aspirin 81 MG tablet Take 81 mg by mouth 2 (two) times a week.   B Complex Vitamins (VITAMIN-B COMPLEX PO) Take 1 tablet by mouth daily.   levothyroxine (SYNTHROID) 50 MCG tablet Take 1 tablet (50 mcg total) by mouth daily.   lisinopril-hydrochlorothiazide (ZESTORETIC) 20-12.5 MG tablet Take 1 tablet by mouth daily.   metoprolol tartrate (LOPRESSOR) 50 MG tablet Take 50 mg by mouth 2 (two) times daily.   Potassium Chloride ER 20 MEQ TBCR Take 1 tablet (20 mEq total) by mouth 2 (two) times daily.   spironolactone (ALDACTONE) 25 MG tablet Take 1 tablet (25 mg total) by mouth daily.   [DISCONTINUED] amLODipine-benazepril (LOTREL) 5-20 MG  capsule Take 1 capsule by mouth daily.   [DISCONTINUED] amLODipine-olmesartan (AZOR) 5-20 MG tablet Take 1 tablet by mouth daily.   [DISCONTINUED] COVID-19 mRNA vaccine 2023-2024 (COMIRNATY) syringe Inject into the muscle. (Patient taking differently: Inject 0.3 mLs into the muscle once.)   [DISCONTINUED] influenza vaccine adjuvanted (FLUAD QUADRIVALENT) 0.5 ML injection Inject into the muscle. (Patient taking differently: Inject 0.5 mLs into the muscle once.)   [DISCONTINUED] losartan-hydrochlorothiazide (HYZAAR) 50-12.5 MG tablet Take 1 tablet by mouth daily.   [DISCONTINUED] Magnesium 400 MG TABS Take 1 tablet by mouth daily.   [DISCONTINUED] Multiple Vitamin (MULTIVITAMIN) capsule Take 1 capsule by mouth daily.   [DISCONTINUED] tiZANidine (ZANAFLEX) 4 MG tablet Take 1 tablet (4 mg total) by mouth every 6 (six) to 8 (eight) hours as needed, not to exceed 3 doses in 24 hours. (Patient taking differently: Take 4 mg by mouth every 8 (eight) hours as needed for muscle spasms.)   No facility-administered encounter medications on file as of 12/04/2022.   REVIEW OF SYSTEMS:  Gen: Denies fever, sweats or chills. No weight loss.  CV: See HPI. Resp: Denies cough, shortness of breath of hemoptysis.  GI: Denies heartburn, dysphagia, stomach or lower abdominal pain. No diarrhea or constipation.  GU : Denies urinary burning, blood in urine, increased urinary frequency or incontinence. MS: Denies joint pain, muscles aches or weakness. Derm: Denies rash, itchiness, skin lesions or unhealing ulcers. Psych: Denies depression, anxiety, memory loss or confusion. Heme: Denies bruising, easy bleeding. Neuro:  Denies headaches, dizziness or paresthesias. Endo:  Denies any problems with DM, thyroid or adrenal function.  PHYSICAL EXAM: BP 126/68   Pulse 69   Ht 5\' 5"  (1.651 m)   Wt 213 lb (96.6 kg)   BMI 35.45 kg/m  General: 67 year old female in no acute distress. Head: Normocephalic and atraumatic. Eyes:   Sclerae non-icteric, conjunctive pink. Ears: Normal auditory acuity. Mouth: Dentition intact. No ulcers or lesions.  Neck: Supple, no lymphadenopathy or thyromegaly.  Lungs: Clear bilaterally to auscultation without wheezes, crackles or rhonchi. Heart: Rhythm slightly irregular. Soft systolic murmur. No rub or gallop appreciated.  Abdomen: Soft, nontender, nondistended. No masses. No  hepatosplenomegaly. Normoactive bowel sounds x 4 quadrants.  Large horizontal RUQ scar and vertical midline lower abdominal scars intact. Rectal: Deferred.  Musculoskeletal: Symmetrical with no gross deformities. Skin: Warm and dry. No rash or lesions on visible extremities. Extremities: No edema. Neurological: Alert oriented x 4, no focal deficits.  Psychological:  Alert and cooperative. Normal mood and affect.  ASSESSMENT AND PLAN:  67 year old female with history of GERD and H. Pylori 2018 who presents with RUQ pain x 6 months -EGD scheduled after cardiac clearance received -Famotidine 20 mg daily -CMP -Consider RUQ sonogram to evaluate the CBD if EGD unrevealing  Mild dysphagia/esophageal dysmotility -See plan above  Acute on chronic anemia.  Baseline hemoglobin level 11s with reported hemoglobin level 9.08 September 2022 per labs by PCP. -CBC, IBC + ferritin  -Request copy of 09/2022 labs done by PCP -EGD and colonoscopy to be scheduled after cardiac clearance received  History of a small tubular adenomatous polyp removed from the colon per colonoscopy 05/2018 -Colonoscopy as noted above  Palpitations, sometimes associated with eating. Undergoing evaluation by cardiology. -Proceed with cardiac CT as scheduled 12/09/2022 -Cardiac clearance requested prior to pursuing EGD and colonoscopy    CC:  Jackie Plum, MD

## 2022-12-04 NOTE — Patient Instructions (Addendum)
Your provider has requested that you go to the basement level for lab work before leaving today. Press "B" on the elevator. The lab is located at the first door on the left as you exit the elevator.  Cardiac clearance is required prior to scheduling your procedure.  Due to recent changes in healthcare laws, you may see the results of your imaging and laboratory studies on MyChart before your provider has had a chance to review them.  We understand that in some cases there may be results that are confusing or concerning to you. Not all laboratory results come back in the same time frame and the provider may be waiting for multiple results in order to interpret others.  Please give [place] 48 hours in order for your provider to thoroughly review all the results before contacting the office for clarification of your results.   Thank you for trusting me with your gastrointestinal care!   Alcide Evener, CRNP

## 2022-12-07 ENCOUNTER — Other Ambulatory Visit: Payer: Self-pay

## 2022-12-07 ENCOUNTER — Other Ambulatory Visit (HOSPITAL_BASED_OUTPATIENT_CLINIC_OR_DEPARTMENT_OTHER): Payer: Self-pay

## 2022-12-07 DIAGNOSIS — D649 Anemia, unspecified: Secondary | ICD-10-CM

## 2022-12-07 MED ORDER — FERROUS SULFATE 325 (65 FE) MG PO TABS
325.0000 mg | ORAL_TABLET | ORAL | 0 refills | Status: DC
  Filled 2022-12-07: qty 100, 233d supply, fill #0

## 2022-12-09 ENCOUNTER — Other Ambulatory Visit (HOSPITAL_BASED_OUTPATIENT_CLINIC_OR_DEPARTMENT_OTHER): Payer: Self-pay | Admitting: Internal Medicine

## 2022-12-09 ENCOUNTER — Ambulatory Visit (HOSPITAL_BASED_OUTPATIENT_CLINIC_OR_DEPARTMENT_OTHER): Payer: Commercial Managed Care - PPO

## 2022-12-09 ENCOUNTER — Ambulatory Visit (HOSPITAL_BASED_OUTPATIENT_CLINIC_OR_DEPARTMENT_OTHER)
Admission: RE | Admit: 2022-12-09 | Discharge: 2022-12-09 | Disposition: A | Discharge status: Home / Self Care | Payer: Commercial Managed Care - PPO | Source: Ambulatory Visit | Attending: Cardiology | Admitting: Cardiology

## 2022-12-09 DIAGNOSIS — Z1231 Encounter for screening mammogram for malignant neoplasm of breast: Secondary | ICD-10-CM

## 2022-12-09 DIAGNOSIS — R0602 Shortness of breath: Secondary | ICD-10-CM | POA: Insufficient documentation

## 2022-12-09 DIAGNOSIS — E7800 Pure hypercholesterolemia, unspecified: Secondary | ICD-10-CM | POA: Insufficient documentation

## 2022-12-17 ENCOUNTER — Telehealth: Payer: Self-pay | Admitting: Nurse Practitioner

## 2022-12-17 ENCOUNTER — Telehealth: Payer: Self-pay

## 2022-12-17 DIAGNOSIS — H524 Presbyopia: Secondary | ICD-10-CM | POA: Diagnosis not present

## 2022-12-17 DIAGNOSIS — H31001 Unspecified chorioretinal scars, right eye: Secondary | ICD-10-CM | POA: Diagnosis not present

## 2022-12-17 NOTE — Telephone Encounter (Signed)
DD, refer to office visit 12/04/2022, pls send request for a cardiac clearance to her cardiologist Dr. Bing Matter. EGD and colonoscopy to be scheduled after cardiac clearance received.

## 2022-12-17 NOTE — Telephone Encounter (Signed)
[facility] [address] [place], [place]  [address] Phone:  [phone]   Fax:  [phone]   Janet Phelps DOB: [birth year] MRN: [id number]  Dear: Bing Matter:   The patient above is schedule for a EGD & Colonoscopy in the near future under general anesthesia (Propofol).    Please fax or route a note of Medical Clearance to [phone], Attn: Janthony Holleman, CMA  Please advise if this patient will require an office visit or further medical work-up before clearance can be given.   Thank you,    [facility]

## 2022-12-17 NOTE — Telephone Encounter (Signed)
Noted! Thank you

## 2022-12-18 ENCOUNTER — Telehealth: Payer: Self-pay

## 2022-12-18 NOTE — Telephone Encounter (Addendum)
   Pre-operative Risk Assessment    Patient Name: Janet Phelps  DOB: 09-10-1955 MRN: [id number]   PT HAS APPT WITH DR. Bing Matter 01/01/23. I WILL FORWARD CLEARANCE REQUEST TO MD FOR APPT 01/01/23.   Request for Surgical Clearance    Procedure:   COLONOSCOPY & EGD  Date of Surgery:  Clearance TBD                                 Surgeon:  DR. Ileene Patrick Surgeon's Group or Practice Name:  [facility] Phone number:  [phone] Fax number:  [phone] ATTN: Hartford Poli, CMA   Type of Clearance Requested:   - Medical ; NO MEDICATIONS LISTED AS NEEDING TO BE HELD   Type of Anesthesia:   PROPOFOL   Additional requests/questions:    Elpidio Anis   12/18/2022, 10:32 AM

## 2022-12-18 NOTE — Telephone Encounter (Signed)
Pt viewed results in My Chart per Dr. Krasowski's note. Routed to PCP.  

## 2022-12-23 DIAGNOSIS — E039 Hypothyroidism, unspecified: Secondary | ICD-10-CM | POA: Diagnosis not present

## 2022-12-23 DIAGNOSIS — R002 Palpitations: Secondary | ICD-10-CM

## 2022-12-23 DIAGNOSIS — D649 Anemia, unspecified: Secondary | ICD-10-CM | POA: Diagnosis not present

## 2022-12-23 DIAGNOSIS — E782 Mixed hyperlipidemia: Secondary | ICD-10-CM | POA: Diagnosis not present

## 2022-12-23 DIAGNOSIS — I10 Essential (primary) hypertension: Secondary | ICD-10-CM | POA: Diagnosis not present

## 2022-12-23 DIAGNOSIS — R7303 Prediabetes: Secondary | ICD-10-CM | POA: Diagnosis not present

## 2022-12-23 DIAGNOSIS — K219 Gastro-esophageal reflux disease without esophagitis: Secondary | ICD-10-CM | POA: Diagnosis not present

## 2022-12-24 DIAGNOSIS — K219 Gastro-esophageal reflux disease without esophagitis: Secondary | ICD-10-CM | POA: Diagnosis not present

## 2022-12-24 DIAGNOSIS — Z0001 Encounter for general adult medical examination with abnormal findings: Secondary | ICD-10-CM | POA: Diagnosis not present

## 2022-12-24 DIAGNOSIS — D649 Anemia, unspecified: Secondary | ICD-10-CM | POA: Diagnosis not present

## 2022-12-24 DIAGNOSIS — R7303 Prediabetes: Secondary | ICD-10-CM | POA: Diagnosis not present

## 2022-12-24 DIAGNOSIS — E039 Hypothyroidism, unspecified: Secondary | ICD-10-CM | POA: Diagnosis not present

## 2022-12-24 DIAGNOSIS — R002 Palpitations: Secondary | ICD-10-CM | POA: Diagnosis not present

## 2022-12-24 DIAGNOSIS — I10 Essential (primary) hypertension: Secondary | ICD-10-CM | POA: Diagnosis not present

## 2022-12-24 DIAGNOSIS — E782 Mixed hyperlipidemia: Secondary | ICD-10-CM | POA: Diagnosis not present

## 2022-12-30 DIAGNOSIS — G4733 Obstructive sleep apnea (adult) (pediatric): Secondary | ICD-10-CM | POA: Diagnosis not present

## 2022-12-31 ENCOUNTER — Other Ambulatory Visit (HOSPITAL_BASED_OUTPATIENT_CLINIC_OR_DEPARTMENT_OTHER): Payer: Self-pay

## 2023-01-01 ENCOUNTER — Other Ambulatory Visit (HOSPITAL_BASED_OUTPATIENT_CLINIC_OR_DEPARTMENT_OTHER): Payer: Self-pay

## 2023-01-01 ENCOUNTER — Encounter: Payer: Self-pay | Admitting: Cardiology

## 2023-01-01 ENCOUNTER — Encounter (HOSPITAL_COMMUNITY): Payer: Self-pay

## 2023-01-01 ENCOUNTER — Ambulatory Visit: Payer: Commercial Managed Care - PPO | Attending: Cardiology | Admitting: Cardiology

## 2023-01-01 VITALS — BP 128/78 | HR 73 | Ht 65.0 in | Wt 212.0 lb

## 2023-01-01 DIAGNOSIS — E7800 Pure hypercholesterolemia, unspecified: Secondary | ICD-10-CM | POA: Diagnosis not present

## 2023-01-01 DIAGNOSIS — R931 Abnormal findings on diagnostic imaging of heart and coronary circulation: Secondary | ICD-10-CM | POA: Insufficient documentation

## 2023-01-01 DIAGNOSIS — I493 Ventricular premature depolarization: Secondary | ICD-10-CM

## 2023-01-01 DIAGNOSIS — I10 Essential (primary) hypertension: Secondary | ICD-10-CM

## 2023-01-01 DIAGNOSIS — R079 Chest pain, unspecified: Secondary | ICD-10-CM | POA: Diagnosis not present

## 2023-01-01 DIAGNOSIS — E876 Hypokalemia: Secondary | ICD-10-CM

## 2023-01-01 DIAGNOSIS — R002 Palpitations: Secondary | ICD-10-CM

## 2023-01-01 MED ORDER — DILTIAZEM HCL 30 MG PO TABS
30.0000 mg | ORAL_TABLET | Freq: Three times a day (TID) | ORAL | 3 refills | Status: DC
  Filled 2023-01-01 – 2023-01-04 (×2): qty 270, 90d supply, fill #0
  Filled 2023-03-29: qty 270, 90d supply, fill #1

## 2023-01-01 MED ORDER — POTASSIUM CHLORIDE ER 20 MEQ PO TBCR
20.0000 meq | EXTENDED_RELEASE_TABLET | Freq: Three times a day (TID) | ORAL | 3 refills | Status: DC
  Filled 2023-01-01 – 2023-01-21 (×2): qty 270, 90d supply, fill #0
  Filled 2023-04-19: qty 270, 90d supply, fill #1
  Filled 2023-07-19: qty 270, 90d supply, fill #2
  Filled 2023-07-22: qty 90, 30d supply, fill #2
  Filled 2023-08-16: qty 90, 30d supply, fill #3
  Filled 2023-09-13: qty 90, 30d supply, fill #4

## 2023-01-01 NOTE — Patient Instructions (Addendum)
Medication Instructions:   START: Cardizem 30mg  1 tablet three times daily  START: Potassium 1 tablet 3 times daily  Lab Work: 3rd Floor   [address]  Your physician recommends that you return for lab work in:  1 week   You need to have labs done when you are fasting.  You can come Monday through Friday 8:00 am to 11:30am and 1:00 to 4:00. You do not need to make an appointment as the order has already been placed. The labs you are going to have done are  BMP   Testing/Procedures: Your physician has requested that you have a Exercise Cardiolite. For further information please visit [email or web address] Please follow instruction sheet, as given.  The test will take approximately 3 to 4 hours to complete; you may bring reading material.  If someone comes with you to your appointment, they will need to remain in the main lobby due to limited space in the testing area.   How to prepare for your Myocardial Perfusion Test: Do not eat or drink 3 hours prior to your test, except you may have water. Do not consume products containing caffeine (regular or decaffeinated) 12 hours prior to your test. (ex: coffee, chocolate, sodas, tea). Do bring a list of your current medications with you.  If not listed below, you may take your medications as normal. Do wear comfortable clothes (no dresses or overalls) and walking shoes, tennis shoes preferred (No heels or open toe shoes are allowed). Do NOT wear cologne, perfume, aftershave, or lotions (deodorant is allowed). If these instructions are not followed, your test will have to be rescheduled.     Follow-Up: At [facility], you and your health needs are our priority.  As part of our continuing mission to provide you with exceptional heart care, we have created designated Provider Care Teams.  These Care Teams include your primary Cardiologist (physician) and Advanced Practice Providers (APPs -  Physician Assistants and Nurse Practitioners) who all  work together to provide you with the care you need, when you need it.  We recommend signing up for the patient portal called "MyChart".  Sign up information is provided on this After Visit Summary.  MyChart is used to connect with patients for Virtual Visits (Telemedicine).  Patients are able to view lab/test results, encounter notes, upcoming appointments, etc.  Non-urgent messages can be sent to your provider as well.   To learn more about what you can do with MyChart, go to [email or web address].    Your next appointment:   2 month(s)  The format for your next appointment:   In Person  Provider:   Gypsy Balsam, MD    Other Instructions NA

## 2023-01-01 NOTE — Progress Notes (Unsigned)
Cardiology Office Note:    Date:  01/01/2023   ID:  Janet, Phelps 12-19-1955, MRN [id number]  PCP:  Jackie Plum, MD  Cardiologist:  Gypsy Balsam, MD    Referring MD: Jackie Plum, MD   Chief Complaint  Patient presents with   Palpitations    More frequent, and intense    History of Present Illness:    Janet Phelps is a 67 y.o. female with past medical history significant for essential hypertension, palpitations, anemia, diabetes, dyslipidemia.  She came to [place] because she wanted to be checked for palpitations that she has been experiencing.  On top of that her son had sudden cardiac death, he did have ASD repair when he was small and after that he got the atrial flutter ablation done, on top of that she lost her husband.  Obviously she is very concerned that she wanted to be checked.  She works as a Engineer, civil (consulting).  So far we did monitor which showed episodes of PVCs however no sustained arrhythmia, symptomatic.  She was put on beta-blocker however was unable to tolerate it because of bradycardia so beta-blocker dose has been reduced and she is being put on long-acting metoprolol succinate 50 mg daily, she also got calcium score done which show a calcium score of 7 which is 67 percentile, echocardiogram done preserved ejection fraction but pseudonormal pattern on the transmitral flow suggesting elevated pulmonary wedge pressure at the time of the study. Comes today to months for follow-up palpitations is slightly worse.  She also described 2 episode of chest pain that happen when she was sitting out with exercise.  Reports to have fatigue tiredness and shortness of breath while walking.  Past Medical History:  Diagnosis Date   Abnormal blood chemistry 07/18/2013   Abnormal urine 01/04/2017   Anemia 05/09/2018   Chronic RLQ pain 01/04/2017   Costovertebral angle tenderness 01/04/2017   Diabetes mellitus (HCC)    History of gestational diabetes 05/09/2012    GDM 1 pregnancy on  insulin   History of Helicobacter pylori infection 06/25/2017   Hypercholesterolemia 07/18/2013   Hypokalemia 02/28/2014   Insomnia 07/18/2013   Mastodynia 06/06/2013   Nausea 06/04/2015   Newly recognized heart murmur 12/22/2013   Other benign mammary dysplasias of right breast 01/14/2015   Right flank pain 01/04/2017   Sciatica    Screening for colon cancer 01/14/2015   Seasonal allergies 06/04/2015   Sensation of lump in throat 06/04/2015   Severe obesity (BMI 35.0-39.9) with comorbidity (HCC) 06/25/2017   Snoring 12/23/2016   Vaginal pain 01/14/2015   Vitamin D deficiency 07/23/2017    Past Surgical History:  Procedure Laterality Date   CESAREAN SECTION     x3   CHOLECYSTECTOMY     COLPOSCOPY     LUMBAR LAMINECTOMY     TONSILLECTOMY      Current Medications: Current Meds  Medication Sig   Ascorbic Acid (VITAMIN C PO) Take 1 tablet by mouth daily.   aspirin 81 MG tablet Take 81 mg by mouth 2 (two) times a week.   B Complex Vitamins (VITAMIN-B COMPLEX PO) Take 1 tablet by mouth daily.   famotidine (PEPCID) 20 MG tablet Take 1 tablet (20 mg total) by mouth daily.   ferrous sulfate 325 (65 FE) MG tablet Take 1 tablet (325 mg total) by mouth every Monday, Wednesday, and Friday.   levothyroxine (SYNTHROID) 50 MCG tablet Take 1 tablet (50 mcg total) by mouth daily.   lisinopril-hydrochlorothiazide (ZESTORETIC) 20-12.5 MG  tablet Take 1 tablet by mouth daily.   metoprolol tartrate (LOPRESSOR) 50 MG tablet Take 50 mg by mouth daily.   Potassium Chloride ER 20 MEQ TBCR Take 1 tablet (20 mEq total) by mouth 2 (two) times daily.   spironolactone (ALDACTONE) 25 MG tablet Take 1 tablet (25 mg total) by mouth daily.   [DISCONTINUED] Misc. Devices MISC Inhale 1 each into the lungs daily.     Allergies:   Labetalol and Benazepril   Social History   Socioeconomic History   Marital status: Single    Spouse name: Not on file   Number of children: 4   Years  of education: Not on file   Highest education level: Not on file  Occupational History   Not on file  Tobacco Use   Smoking status: Never   Smokeless tobacco: Never  Vaping Use   Vaping status: Never Used  Substance and Sexual Activity   Alcohol use: Yes    Comment: OCC   Drug use: No   Sexual activity: Never  Other Topics Concern   Not on file  Social History Narrative   Not on file   Social Determinants of Health   Financial Resource Strain: Low Risk  (07/01/2022)   Received from [facility], [facility]   Overall Financial Resource Strain (CARDIA)    Difficulty of Paying Living Expenses: Not hard at all  Food Insecurity: No Food Insecurity (07/01/2022)   Received from [facility], [facility]   Hunger Vital Sign    Worried About Running Out of Food in the Last Year: Never true    Ran Out of Food in the Last Year: Never true  Transportation Needs: No Transportation Needs (07/01/2022)   Received from [facility], [facility]   PRAPARE - Transportation    Lack of Transportation (Medical): No    Lack of Transportation (Non-Medical): No  Physical Activity: Insufficiently Active (09/24/2021)   Received from [facility], [facility]   Exercise Vital Sign    Days of Exercise per Week: 1 day    Minutes of Exercise per Session: 20 min  Stress: No Stress Concern Present (09/24/2021)   Received from [facility], [facility] Occupational Health - Occupational Stress Questionnaire    Feeling of Stress : Not at all  Social Connections: Unknown (09/26/2022)   Received from [facility], [facility]   Social Network    Social Network: Not on file     Family History: The patient's family history includes Hypertension in her mother, sister, sister, and sister; Stroke in her mother; Thyroid disease in her mother. ROS:   Please see the history of present illness.    All 14 point review of systems negative except as described per history of  present illness  EKGs/Labs/Other Studies Reviewed:         Recent Labs: 10/17/2022: B Natriuretic Peptide 52.9; Magnesium 2.1; TSH 6.883 12/04/2022: ALT 11; BUN 14; Creatinine, Ser 0.94; Hemoglobin 11.5; Platelets 214.0; Potassium 3.9; Sodium 139  Recent Lipid Panel No results found for: "CHOL", "TRIG", "HDL", "CHOLHDL", "VLDL", "LDLCALC", "LDLDIRECT"  Physical Exam:    VS:  BP 128/78 (BP Location: Left Arm, Patient Position: Sitting)   Pulse 73   Ht 5\' 5"  (1.651 m)   Wt 212 lb (96.2 kg)   SpO2 95%   BMI 35.28 kg/m     Wt Readings from Last 3 Encounters:  01/01/23 212 lb (96.2 kg)  12/04/22 213 lb (96.6 kg)  10/28/22 214 lb (97.1 kg)     GEN:  Well nourished, well developed in no acute distress HEENT: Normal NECK: No JVD; No carotid bruits LYMPHATICS: No lymphadenopathy CARDIAC: RRR, no murmurs, no rubs, no gallops RESPIRATORY:  Clear to auscultation without rales, wheezing or rhonchi  ABDOMEN: Soft, non-tender, non-distended MUSCULOSKELETAL:  No edema; No deformity  SKIN: Warm and dry LOWER EXTREMITIES: no swelling NEUROLOGIC:  Alert and oriented x 3 PSYCHIATRIC:  Normal affect   ASSESSMENT:    1. Palpitations   2. Primary hypertension   3. PVC's (premature ventricular contractions)   4. Hypercholesterolemia   5. Hypokalemia   6. Agatston coronary artery calcium score less than 100, 7 which is 67 percentile    PLAN:    In order of problems listed above:  Palpitations.  Look like PVCs.  No sustained arrhythmia likely, will ask her to add Cardizem 30 mg every 8 hours, however, at first what she wants to do is just try long-acting metoprolol at evening time and see if that helps.  She also had low potassium I will increase dose of potassium she takes 20 mg twice daily will increase to 20 mg 3 times a day next week she will have Chem-7 done to check potassium.  She already took magnesium before but magnesium level was normal. Elevated calcium score only minimally  but at the same time she got 2 episode of chest pain and got frequent ventricular ectopy.  Will schedule her to have stress test to make sure she does have any inducible ischemia. Hypercholesterolemia.  Her LDL was 107.  We initiated conversation about potentially starting some medication for it.  However first I want get her palpitations under cannot control before we start adding some extra medications.   Medication Adjustments/Labs and Tests Ordered: Current medicines are reviewed at length with the patient today.  Concerns regarding medicines are outlined above.  Orders Placed This Encounter  Procedures   EKG 12-Lead   Medication changes: No orders of the defined types were placed in this encounter.   Signed, Georgeanna Lea, MD, [facility] 01/01/2023 1:44 PM    [facility]

## 2023-01-04 ENCOUNTER — Other Ambulatory Visit (HOSPITAL_COMMUNITY): Payer: Self-pay

## 2023-01-04 ENCOUNTER — Other Ambulatory Visit (HOSPITAL_BASED_OUTPATIENT_CLINIC_OR_DEPARTMENT_OTHER): Payer: Self-pay

## 2023-01-06 ENCOUNTER — Ambulatory Visit (HOSPITAL_COMMUNITY): Payer: Commercial Managed Care - PPO | Attending: Cardiology

## 2023-01-06 DIAGNOSIS — R079 Chest pain, unspecified: Secondary | ICD-10-CM | POA: Diagnosis not present

## 2023-01-06 LAB — MYOCARDIAL PERFUSION IMAGING
Estimated workload: 1
Exercise duration (min): 1 min
Exercise duration (sec): 0 s
LV dias vol: 77 mL (ref 46–106)
LV sys vol: 30 mL
MPHR: 154 {beats}/min
Nuc Stress EF: 61 %
Peak HR: 111 {beats}/min
Percent HR: 72 %
Rest HR: 58 {beats}/min
Rest Nuclear Isotope Dose: 10.8 mCi
SDS: 1
SRS: 0
SSS: 1
Stress Nuclear Isotope Dose: 31 mCi
TID: 0.84

## 2023-01-06 MED ORDER — REGADENOSON 0.4 MG/5ML IV SOLN
0.4000 mg | Freq: Once | INTRAVENOUS | Status: AC
Start: 2023-01-06 — End: 2023-01-06
  Administered 2023-01-06: 0.4 mg via INTRAVENOUS

## 2023-01-06 MED ORDER — TECHNETIUM TC 99M TETROFOSMIN IV KIT
31.0000 | PACK | Freq: Once | INTRAVENOUS | Status: AC | PRN
  Administered 2023-01-06: 31 via INTRAVENOUS

## 2023-01-06 MED ORDER — TECHNETIUM TC 99M TETROFOSMIN IV KIT
10.8000 | PACK | Freq: Once | INTRAVENOUS | Status: AC | PRN
  Administered 2023-01-06: 10.8 via INTRAVENOUS

## 2023-01-08 ENCOUNTER — Telehealth: Payer: Self-pay | Admitting: Nurse Practitioner

## 2023-01-08 ENCOUNTER — Telehealth: Payer: Self-pay

## 2023-01-08 ENCOUNTER — Other Ambulatory Visit: Payer: Self-pay

## 2023-01-08 DIAGNOSIS — D649 Anemia, unspecified: Secondary | ICD-10-CM

## 2023-01-08 DIAGNOSIS — Z86010 Personal history of colonic polyps: Secondary | ICD-10-CM

## 2023-01-08 DIAGNOSIS — R1310 Dysphagia, unspecified: Secondary | ICD-10-CM

## 2023-01-08 NOTE — Telephone Encounter (Signed)
Patient called stating that her Cardiologist has cleared her to have her EGD and colonoscopy. Patient is wanted to schedule. Also patient has questions about iron. Please advise.

## 2023-01-08 NOTE — Telephone Encounter (Signed)
[facility] Pre-operative Risk Assessment     Request for surgical clearance:     Endoscopy Procedure  What type of surgery is being performed?     Endo colon  When is this surgery scheduled?     02/17/23  What type of clearance is required ?   Cardiac  Are there any medications that need to be held prior to surgery and how long?   Practice name and name of physician performing surgery?      [facility]  What is your office phone and fax number?      Phone- [phone]  Fax- [phone]  Anesthesia type (None, local, MAC, general) ?       MAC

## 2023-01-08 NOTE — Telephone Encounter (Signed)
I calle pt to let pt know stress test was normal and no pending tests. Pt states that since she has been taking the cardizem she is feeling much better and has not had any issues.

## 2023-01-08 NOTE — Telephone Encounter (Signed)
Please let patient know her stress test from 01/06/2023 was normal and I do not see any other testing pending for her.   Thank you!  DW

## 2023-01-08 NOTE — Telephone Encounter (Signed)
Mychart message sent.

## 2023-01-08 NOTE — Telephone Encounter (Signed)
Inbound call from patient. Advised of mychart message.

## 2023-01-08 NOTE — Telephone Encounter (Signed)
Patient notified of results.

## 2023-01-08 NOTE — Telephone Encounter (Signed)
Attempted to reach patient again.

## 2023-01-08 NOTE — Telephone Encounter (Signed)
-----   Message from Gypsy Balsam sent at 01/08/2023 11:24 AM EDT ----- Stress test was normal

## 2023-01-08 NOTE — Telephone Encounter (Signed)
Spoke with patient & she states she had appointment with cardio and received clearance for procedure. Refer to phone note 12/17/22, I've requested clearance from cardio to confirm. Scheduled endo colon for 02/17/23 at 2:00 pm with Dr. Adela Lank in the [facility]. Amb ref placed. Instructions sent to [facility] & discussed over the phone. Pt denies any blood thinners, diabetic, or weight loss injections. She would also like to have labs faxed to [facility]. After speaking with her I called [facility] to confirm & they do not do outpatient labs. Tried calling patient back & had to leave a message.

## 2023-01-08 NOTE — Telephone Encounter (Signed)
PT is returning call about labs. Also wanted to let us know she has been cleared by her cardiologist to have ECL. Please advise.

## 2023-01-11 ENCOUNTER — Other Ambulatory Visit: Payer: Self-pay

## 2023-01-11 DIAGNOSIS — D649 Anemia, unspecified: Secondary | ICD-10-CM

## 2023-01-11 DIAGNOSIS — I10 Essential (primary) hypertension: Secondary | ICD-10-CM | POA: Diagnosis not present

## 2023-01-11 NOTE — Telephone Encounter (Signed)
Inbound call from [facility] regarding lab orders needing to be released. Please advise, thank you.

## 2023-01-11 NOTE — Telephone Encounter (Signed)
PT is calling back about labs. [facility] and she is saying that the order needs to be released so they can print off the labels for the tubes. Please advise.

## 2023-01-11 NOTE — Telephone Encounter (Signed)
Spoke with Pt. Pt stated that she will come by our lab to get labs drawn tomorrow morning. Pt verbalized understanding with all questions answered.

## 2023-01-12 ENCOUNTER — Telehealth: Payer: Self-pay

## 2023-01-12 ENCOUNTER — Other Ambulatory Visit (HOSPITAL_BASED_OUTPATIENT_CLINIC_OR_DEPARTMENT_OTHER): Payer: Self-pay

## 2023-01-12 ENCOUNTER — Other Ambulatory Visit (INDEPENDENT_AMBULATORY_CARE_PROVIDER_SITE_OTHER): Payer: Commercial Managed Care - PPO

## 2023-01-12 DIAGNOSIS — D649 Anemia, unspecified: Secondary | ICD-10-CM

## 2023-01-12 LAB — CBC WITH DIFFERENTIAL/PLATELET
Basophils Absolute: 0 10*3/uL (ref 0.0–0.1)
Basophils Relative: 0.4 % (ref 0.0–3.0)
Eosinophils Absolute: 0.2 10*3/uL (ref 0.0–0.7)
Eosinophils Relative: 3.6 % (ref 0.0–5.0)
HCT: 36.1 % (ref 36.0–46.0)
Hemoglobin: 11.7 g/dL — ABNORMAL LOW (ref 12.0–15.0)
Lymphocytes Relative: 35.3 % (ref 12.0–46.0)
Lymphs Abs: 1.8 10*3/uL (ref 0.7–4.0)
MCHC: 32.3 g/dL (ref 30.0–36.0)
MCV: 96.8 fl (ref 78.0–100.0)
Monocytes Absolute: 0.4 10*3/uL (ref 0.1–1.0)
Monocytes Relative: 7.5 % (ref 3.0–12.0)
Neutro Abs: 2.7 10*3/uL (ref 1.4–7.7)
Neutrophils Relative %: 53.2 % (ref 43.0–77.0)
Platelets: 204 10*3/uL (ref 150.0–400.0)
RBC: 3.73 Mil/uL — ABNORMAL LOW (ref 3.87–5.11)
RDW: 14.6 % (ref 11.5–15.5)
WBC: 5 10*3/uL (ref 4.0–10.5)

## 2023-01-12 LAB — IBC + FERRITIN
Ferritin: 23.8 ng/mL (ref 10.0–291.0)
Iron: 103 ug/dL (ref 42–145)
Saturation Ratios: 25.1 % (ref 20.0–50.0)
TIBC: 410.2 ug/dL (ref 250.0–450.0)
Transferrin: 293 mg/dL (ref 212.0–360.0)

## 2023-01-12 MED ORDER — POTASSIUM CHLORIDE ER 20 MEQ PO TBCR
20.0000 meq | EXTENDED_RELEASE_TABLET | Freq: Every day | ORAL | 2 refills | Status: DC
  Filled 2023-01-12 – 2023-01-26 (×11): qty 30, 30d supply, fill #0

## 2023-01-12 NOTE — Telephone Encounter (Signed)
Pt is returning call.  

## 2023-01-12 NOTE — Telephone Encounter (Signed)
LM to return my call. 

## 2023-01-12 NOTE — Telephone Encounter (Addendum)
-----   Message from Gypsy Balsam sent at 01/08/2023  4:46 PM EDT ----- In this case lets increase metoprolol to 75 mg daily-metoprolol succinate ----- Message ----- From: Heywood Bene, CMA Sent: 12/29/2022   4:34 PM EDT To: Georgeanna Lea, MD  Patient notified of results, patient said  palpitation increased and more frequent. Please advise ----- Message ----- From: Georgeanna Lea, MD Sent: 12/25/2022   1:54 PM EDT To: Neena Rhymes, RN  Monitor showed only PVCs, 2% no need to treat unles ssymptomatic

## 2023-01-12 NOTE — Telephone Encounter (Signed)
-----   Message from Gypsy Balsam sent at 01/08/2023  4:46 PM EDT ----- In this case lets increase metoprolol to 75 mg daily-metoprolol succinate ----- Message ----- From: Heywood Bene, CMA Sent: 12/29/2022   4:34 PM EDT To: Georgeanna Lea, MD  Patient notified of results, patient said  palpitation increased and more frequent. Please advise ----- Message ----- From: Georgeanna Lea, MD Sent: 12/25/2022   1:54 PM EDT To: Neena Rhymes, RN  Monitor showed only PVCs, 2% no need to treat unles ssymptomatic

## 2023-01-12 NOTE — Telephone Encounter (Signed)
Returned patient's call and left a message.

## 2023-01-13 ENCOUNTER — Other Ambulatory Visit: Payer: Self-pay

## 2023-01-13 ENCOUNTER — Other Ambulatory Visit (HOSPITAL_BASED_OUTPATIENT_CLINIC_OR_DEPARTMENT_OTHER): Payer: Self-pay

## 2023-01-14 ENCOUNTER — Other Ambulatory Visit (HOSPITAL_BASED_OUTPATIENT_CLINIC_OR_DEPARTMENT_OTHER): Payer: Self-pay

## 2023-01-15 ENCOUNTER — Telehealth: Payer: Self-pay | Admitting: Nurse Practitioner

## 2023-01-15 ENCOUNTER — Other Ambulatory Visit (HOSPITAL_BASED_OUTPATIENT_CLINIC_OR_DEPARTMENT_OTHER): Payer: Self-pay

## 2023-01-15 NOTE — Telephone Encounter (Signed)
-----   Message from Benancio Deeds sent at 01/15/2023  8:29 AM EDT ----- I think okay to proceed. Negative nuclear stress test, cardiac CT, and echo at this point. Thanks ----- Message ----- From: Arnaldo Natal, NP Sent: 01/14/2023  12:22 PM EDT To: Benancio Deeds, MD  Dr. Adela Lank, see cardiology phone note 01/08/2023. Stress test was negative. Cardiology NP did not formally state patient was cleared for endoscopic procedure, stated no other cardiac testing planned. Do you accept this as cardiac clearance? Patient is  scheduled for an EGD/colonoscopy with you on 9/11.

## 2023-01-15 NOTE — Telephone Encounter (Signed)
Patient to proceed with EGD and colonoscopy 02/17/2023 as scheduled as verified by Dr. Adela Lank.

## 2023-01-18 ENCOUNTER — Other Ambulatory Visit (HOSPITAL_BASED_OUTPATIENT_CLINIC_OR_DEPARTMENT_OTHER): Payer: Self-pay

## 2023-01-19 ENCOUNTER — Other Ambulatory Visit: Payer: Self-pay

## 2023-01-19 ENCOUNTER — Other Ambulatory Visit (HOSPITAL_BASED_OUTPATIENT_CLINIC_OR_DEPARTMENT_OTHER): Payer: Self-pay

## 2023-01-19 MED ORDER — NA SULFATE-K SULFATE-MG SULF 17.5-3.13-1.6 GM/177ML PO SOLN
ORAL | 0 refills | Status: DC
  Filled 2023-01-19: qty 354, 1d supply, fill #0

## 2023-01-20 ENCOUNTER — Other Ambulatory Visit (HOSPITAL_BASED_OUTPATIENT_CLINIC_OR_DEPARTMENT_OTHER): Payer: Self-pay

## 2023-01-21 ENCOUNTER — Other Ambulatory Visit (HOSPITAL_BASED_OUTPATIENT_CLINIC_OR_DEPARTMENT_OTHER): Payer: Self-pay

## 2023-01-21 ENCOUNTER — Other Ambulatory Visit: Payer: Self-pay

## 2023-01-22 ENCOUNTER — Other Ambulatory Visit (HOSPITAL_BASED_OUTPATIENT_CLINIC_OR_DEPARTMENT_OTHER): Payer: Self-pay

## 2023-01-25 ENCOUNTER — Other Ambulatory Visit (HOSPITAL_BASED_OUTPATIENT_CLINIC_OR_DEPARTMENT_OTHER): Payer: Self-pay

## 2023-01-26 ENCOUNTER — Other Ambulatory Visit: Payer: Self-pay

## 2023-01-29 ENCOUNTER — Other Ambulatory Visit (HOSPITAL_BASED_OUTPATIENT_CLINIC_OR_DEPARTMENT_OTHER): Payer: Self-pay

## 2023-01-30 DIAGNOSIS — G4733 Obstructive sleep apnea (adult) (pediatric): Secondary | ICD-10-CM | POA: Diagnosis not present

## 2023-02-01 ENCOUNTER — Other Ambulatory Visit (HOSPITAL_BASED_OUTPATIENT_CLINIC_OR_DEPARTMENT_OTHER): Payer: Self-pay

## 2023-02-04 ENCOUNTER — Encounter: Payer: Self-pay | Admitting: Gastroenterology

## 2023-02-13 ENCOUNTER — Encounter: Payer: Self-pay | Admitting: Certified Registered Nurse Anesthetist

## 2023-02-15 ENCOUNTER — Other Ambulatory Visit (HOSPITAL_BASED_OUTPATIENT_CLINIC_OR_DEPARTMENT_OTHER): Payer: Self-pay

## 2023-02-17 ENCOUNTER — Ambulatory Visit (AMBULATORY_SURGERY_CENTER): Payer: Commercial Managed Care - PPO | Admitting: Gastroenterology

## 2023-02-17 ENCOUNTER — Encounter: Payer: Self-pay | Admitting: Gastroenterology

## 2023-02-17 ENCOUNTER — Other Ambulatory Visit (HOSPITAL_BASED_OUTPATIENT_CLINIC_OR_DEPARTMENT_OTHER): Payer: Self-pay

## 2023-02-17 ENCOUNTER — Telehealth: Payer: Self-pay

## 2023-02-17 VITALS — BP 121/56 | HR 75 | Temp 97.1°F | Resp 23 | Ht 65.0 in | Wt 213.0 lb

## 2023-02-17 DIAGNOSIS — D123 Benign neoplasm of transverse colon: Secondary | ICD-10-CM | POA: Diagnosis not present

## 2023-02-17 DIAGNOSIS — I10 Essential (primary) hypertension: Secondary | ICD-10-CM

## 2023-02-17 DIAGNOSIS — Z09 Encounter for follow-up examination after completed treatment for conditions other than malignant neoplasm: Secondary | ICD-10-CM | POA: Diagnosis not present

## 2023-02-17 DIAGNOSIS — K319 Disease of stomach and duodenum, unspecified: Secondary | ICD-10-CM

## 2023-02-17 DIAGNOSIS — D122 Benign neoplasm of ascending colon: Secondary | ICD-10-CM | POA: Diagnosis not present

## 2023-02-17 DIAGNOSIS — K219 Gastro-esophageal reflux disease without esophagitis: Secondary | ICD-10-CM | POA: Diagnosis not present

## 2023-02-17 DIAGNOSIS — R1310 Dysphagia, unspecified: Secondary | ICD-10-CM

## 2023-02-17 DIAGNOSIS — D649 Anemia, unspecified: Secondary | ICD-10-CM | POA: Diagnosis not present

## 2023-02-17 DIAGNOSIS — Z86010 Personal history of colonic polyps: Secondary | ICD-10-CM | POA: Diagnosis not present

## 2023-02-17 DIAGNOSIS — B9681 Helicobacter pylori [H. pylori] as the cause of diseases classified elsewhere: Secondary | ICD-10-CM | POA: Diagnosis not present

## 2023-02-17 DIAGNOSIS — R1011 Right upper quadrant pain: Secondary | ICD-10-CM

## 2023-02-17 DIAGNOSIS — G4733 Obstructive sleep apnea (adult) (pediatric): Secondary | ICD-10-CM | POA: Diagnosis not present

## 2023-02-17 DIAGNOSIS — K2950 Unspecified chronic gastritis without bleeding: Secondary | ICD-10-CM | POA: Diagnosis not present

## 2023-02-17 MED ORDER — SODIUM CHLORIDE 0.9 % IV SOLN: 500.0000 mL | Freq: Once | INTRAVENOUS | Status: DC

## 2023-02-17 MED ORDER — OMEPRAZOLE 20 MG PO CPDR
20.0000 mg | DELAYED_RELEASE_CAPSULE | Freq: Every day | ORAL | 3 refills | Status: DC
Start: 2023-02-17 — End: 2023-06-07
  Filled 2023-02-17: qty 30, 30d supply, fill #0
  Filled 2023-03-10: qty 30, 30d supply, fill #1
  Filled 2023-04-12: qty 30, 30d supply, fill #2
  Filled 2023-05-12 (×2): qty 30, 30d supply, fill #3

## 2023-02-17 NOTE — Progress Notes (Signed)
[facility] History and Physical   Primary Care Physician:  Jackie Plum, MD   Reason for Procedure:   GERD, RUQ pain, Dysphagia, history of polyps  Plan:    EGD and colonoscopy     HPI: Janet Phelps is a 67 y.o. female  here for EGD and colonoscopy - to evaluate symptoms of GERD, RUQ pain, dysphagia, history of polyps removed. See extensive note from Janet Phelps from 12/04/22.  She has had mild anemia but iron studies normal. Last colonoscopy - 05/2018 - [facility] - adenomas removed.  On pepcid since office visit. History of H pylori remotely. Pepcid has helped reflux but continues to have some post prandial palpitations. Extensive cardiac workup negative.   No family history of colon cancer known. Otherwise feels well without any cardiopulmonary symptoms.   I have discussed risks / benefits of anesthesia and endoscopic procedure with Janet Phelps and they wish to proceed with the exams as outlined today.    Past Medical History:  Diagnosis Date   Abnormal blood chemistry 07/18/2013   Abnormal urine 01/04/2017   Anemia 05/09/2018   Chronic RLQ pain 01/04/2017   Costovertebral angle tenderness 01/04/2017   Diabetes mellitus (HCC)    History of gestational diabetes 05/09/2012   GDM 1 pregnancy on  insulin   History of Helicobacter pylori infection 06/25/2017   HTN (hypertension)    Hypercholesterolemia 07/18/2013   Hypokalemia 02/28/2014   Insomnia 07/18/2013   Mastodynia 06/06/2013   Nausea 06/04/2015   Newly recognized heart murmur 12/22/2013   OSA on CPAP    Other benign mammary dysplasias of right breast 01/14/2015   Right flank pain 01/04/2017   Sciatica    Screening for colon cancer 01/14/2015   Seasonal allergies 06/04/2015   Sensation of lump in throat 06/04/2015   Severe obesity (BMI 35.0-39.9) with comorbidity (HCC) 06/25/2017   Snoring 12/23/2016   Vaginal pain 01/14/2015   Vitamin D deficiency 07/23/2017     Past Surgical History:  Procedure Laterality Date   CESAREAN SECTION     x3   CHOLECYSTECTOMY     COLPOSCOPY     LUMBAR LAMINECTOMY     TONSILLECTOMY      Prior to Admission medications   Medication Sig Start Date End Date Taking? Authorizing Provider  Ascorbic Acid (VITAMIN C PO) Take 1 tablet by mouth daily.    [provider]  aspirin 81 MG tablet Take 81 mg by mouth 2 (two) times a week.    [provider]  B Complex Vitamins (VITAMIN-B COMPLEX PO) Take 1 tablet by mouth daily.    [provider]  diltiazem (CARDIZEM) 30 MG tablet Take 1 tablet (30 mg total) by mouth 3 (three) times daily. 01/01/23   Georgeanna Lea, MD  famotidine (PEPCID) 20 MG tablet Take 1 tablet (20 mg total) by mouth daily. 12/04/22   Arnaldo Natal, NP  ferrous sulfate 325 (65 FE) MG tablet Take 1 tablet (325 mg total) by mouth every Monday, Wednesday, and Friday. 12/07/22   Arnaldo Natal, NP  levothyroxine (SYNTHROID) 50 MCG tablet Take 1 tablet (50 mcg total) by mouth daily. 08/20/22     lisinopril-hydrochlorothiazide (ZESTORETIC) 20-12.5 MG tablet Take 1 tablet by mouth daily. 11/04/22     metoprolol tartrate (LOPRESSOR) 50 MG tablet Take 50 mg by mouth daily.    [provider]  Na Sulfate-K Sulfate-Mg Sulf 17.5-3.13-1.6 GM/177ML SOLN Use as directed. 01/19/23   Arnaldo Natal, NP  Potassium Chloride ER 20 MEQ TBCR Take 1 tablet (20 mEq total) by mouth 3 (three) times daily. 01/01/23   Georgeanna Lea, MD  Potassium Chloride ER 20 MEQ TBCR Take 1 tablet (20 mEq total) by mouth daily for 30 days. 01/12/23     spironolactone (ALDACTONE) 25 MG tablet Take 1 tablet (25 mg total) by mouth daily. 08/20/22     amLODipine-benazepril (LOTREL) 5-20 MG capsule Take 1 capsule by mouth daily. 05/14/22 08/06/22    amLODipine-olmesartan (AZOR) 5-20 MG tablet Take 1 tablet by mouth daily. 06/18/22 08/06/22    losartan-hydrochlorothiazide (HYZAAR) 50-12.5 MG  tablet Take 1 tablet by mouth daily. 10/01/22 11/23/22      Current Outpatient Medications  Medication Sig Dispense Refill   Ascorbic Acid (VITAMIN C PO) Take 1 tablet by mouth daily.     aspirin 81 MG tablet Take 81 mg by mouth 2 (two) times a week.     B Complex Vitamins (VITAMIN-B COMPLEX PO) Take 1 tablet by mouth daily.     diltiazem (CARDIZEM) 30 MG tablet Take 1 tablet (30 mg total) by mouth 3 (three) times daily. 270 tablet 3   famotidine (PEPCID) 20 MG tablet Take 1 tablet (20 mg total) by mouth daily. 30 tablet 1   ferrous sulfate 325 (65 FE) MG tablet Take 1 tablet (325 mg total) by mouth every Monday, Wednesday, and Friday. 100 tablet 0   levothyroxine (SYNTHROID) 50 MCG tablet Take 1 tablet (50 mcg total) by mouth daily. 90 tablet 1   lisinopril-hydrochlorothiazide (ZESTORETIC) 20-12.5 MG tablet Take 1 tablet by mouth daily. 90 tablet 1   metoprolol tartrate (LOPRESSOR) 50 MG tablet Take 50 mg by mouth daily.     Na Sulfate-K Sulfate-Mg Sulf 17.5-3.13-1.6 GM/177ML SOLN Use as directed. 354 mL 0   Potassium Chloride ER 20 MEQ TBCR Take 1 tablet (20 mEq total) by mouth 3 (three) times daily. 270 tablet 3   Potassium Chloride ER 20 MEQ TBCR Take 1 tablet (20 mEq total) by mouth daily for 30 days. 30 tablet 2   spironolactone (ALDACTONE) 25 MG tablet Take 1 tablet (25 mg total) by mouth daily. 90 tablet 1   Current Facility-Administered Medications  Medication Dose Route Frequency Provider Last Rate Last Admin   0.9 %  sodium chloride infusion  500 mL Intravenous Once Dodd Schmid, Willaim Rayas, MD        Allergies as of 02/17/2023 - Review Complete 02/17/2023  Allergen Reaction Noted   Labetalol Itching 03/07/2018   Benazepril Cough 06/05/2022    Family History  Problem Relation Age of Onset   Hypertension Mother    Stroke Mother    Thyroid disease Mother    Hypertension Sister    Hypertension Sister    Hypertension Sister     Social History   Socioeconomic History    Marital status: Single    Spouse name: Not on file   Number of children: 4   Years of education: Not on file   Highest education level: Not on file  Occupational History   Not on file  Tobacco Use   Smoking status: Never   Smokeless tobacco: Never  Vaping Use   Vaping status: Never Used  Substance and Sexual Activity   Alcohol use: Yes    Comment: OCC   Drug use: No   Sexual activity: Never  Other Topics Concern   Not on file  Social History Narrative   Not on file   Social Determinants of Health  Financial Resource Strain: Low Risk  (07/01/2022)   Received from [facility], [facility]   Overall Financial Resource Strain (CARDIA)    Difficulty of Paying Living Expenses: Not hard at all  Food Insecurity: No Food Insecurity (07/01/2022)   Received from [facility], [facility]   Hunger Vital Sign    Worried About Running Out of Food in the Last Year: Never true    Ran Out of Food in the Last Year: Never true  Transportation Needs: No Transportation Needs (07/01/2022)   Received from [facility], [facility]   PRAPARE - Transportation    Lack of Transportation (Medical): No    Lack of Transportation (Non-Medical): No  Physical Activity: Insufficiently Active (09/24/2021)   Received from [facility], [facility]   Exercise Vital Sign    Days of Exercise per Week: 1 day    Minutes of Exercise per Session: 20 min  Stress: No Stress Concern Present (09/24/2021)   Received from [facility], [facility]ational Health - Occupational Stress Questionnaire    Feeling of Stress : Not at all  Social Connections: Unknown (09/26/2022)   Received from [facility], [facility]   Social Network    Social Network: Not on file  Intimate Partner Violence: Unknown (09/26/2022)   Received from [facility], [facility]   HITS    Physically Hurt: Not on file    Insult or Talk Down To: Not on file    Threaten Physical Harm: Not on file     Scream or Curse: Not on file    Review of Systems: All other review of systems negative except as mentioned in the HPI.  Physical Exam: Vital signs BP (!) 106/50   Pulse 89   Temp (!) 97.1 F (36.2 C)   Ht 5\' 5"  (1.651 m)   Wt 213 lb (96.6 kg)   SpO2 100%   BMI 35.45 kg/m   General:   Alert,  Well-developed, pleasant and cooperative in NAD Lungs:  Clear throughout to auscultation.   Heart:  Regular rate and rhythm Abdomen:  Soft, nontender and nondistended.   Neuro/Psych:  Alert and cooperative. Normal mood and affect. A and O x 3  Harlin Rain, MD [facility]

## 2023-02-17 NOTE — Telephone Encounter (Signed)
-----   Message from Gypsy Balsam sent at 01/12/2023 12:41 PM EDT ----- Now potassium getting too high.  According to the notes she takes 3 tablets of potassium when she reduced this only 2 to Chem-7 need to be done within next 2 weeks

## 2023-02-17 NOTE — Op Note (Signed)
[facility] Patient Name: Janet Phelps Procedure Date: 02/17/2023 1:18 PM MRN: [id number] Endoscopist: Viviann Spare P. Adela Lank , MD, [id number] Age: 67 Referring MD:  Date of Birth: March 06, 1956 Gender: Female Account #: [id number] Procedure:                Colonoscopy Indications:              High risk colon cancer surveillance: Personal                            history of colonic polyps - adenomas removed 05/2018 Medicines:                Monitored Anesthesia Care Procedure:                Pre-Anesthesia Assessment:                           - Prior to the procedure, a History and Physical                            was performed, and patient medications and                            allergies were reviewed. The patient's tolerance of                            previous anesthesia was also reviewed. The risks                            and benefits of the procedure and the sedation                            options and risks were discussed with the patient.                            All questions were answered, and informed consent                            was obtained. Prior Anticoagulants: The patient has                            taken no anticoagulant or antiplatelet agents. ASA                            Grade Assessment: II - A patient with mild systemic                            disease. After reviewing the risks and benefits,                            the patient was deemed in satisfactory condition to                            undergo the procedure.  After obtaining informed consent, the colonoscope                            was passed under direct vision. Throughout the                            procedure, the patient's blood pressure, pulse, and                            oxygen saturations were monitored continuously. The                            Olympus Scope [id number] was introduced through the                             anus and advanced to the the terminal ileum, with                            identification of the appendiceal orifice and IC                            valve. The colonoscopy was performed without                            difficulty. The patient tolerated the procedure                            well. The quality of the bowel preparation was                            adequate. The terminal ileum, ileocecal valve,                            appendiceal orifice, and rectum were photographed. Scope In: 1:51:59 PM Scope Out: 2:10:27 PM Scope Withdrawal Time: 0 hours 14 minutes 21 seconds  Total Procedure Duration: 0 hours 18 minutes 28 seconds  Findings:                 The perianal and digital rectal examinations were                            normal.                           The terminal ileum appeared normal.                           A 4 mm polyp was found in the ascending colon. The                            polyp was sessile. The polyp was removed with a                            cold snare. Resection and retrieval were complete.  The colon was long and redundant.                           Internal hemorrhoids were found during retroflexion.                           The exam was otherwise without abnormality. Complications:            No immediate complications. Estimated blood loss:                            Minimal. Estimated Blood Loss:     Estimated blood loss was minimal. Impression:               - The examined portion of the ileum was normal.                           - One 4 mm polyp in the ascending colon, removed                            with a cold snare. Resected and retrieved.                           - Redundant colon.                           - Internal hemorrhoids.                           - The examination was otherwise normal. Recommendation:           - Patient has a contact number available for                             emergencies. The signs and symptoms of potential                            delayed complications were discussed with the                            patient. Return to normal activities tomorrow.                            Written discharge instructions were provided to the                            patient.                           - Resume previous diet.                           - Continue present medications.                           - Await pathology results. Anticipate repeat  colonoscopy in 7 years Willaim Rayas. Naija Troost, MD 02/17/2023 2:14:35 PM This report has been signed electronically.

## 2023-02-17 NOTE — Op Note (Signed)
[facility] Patient Name: Twan Maslow Procedure Date: 02/17/2023 1:18 PM MRN: [id number] Endoscopist: Viviann Spare P. Adela Lank , MD, [id number] Age: 67 Referring MD:  Date of Birth: 1956-04-13 Gender: Female Account #: [id number] Procedure:                Upper GI endoscopy Indications:              Upper abdominal pain - history of H pylori in the                            past, mild intermittent dysphagia, suspected                            gastro-esophageal reflux disease - on pepcid with                            some improvement with symptoms, also having post                            prandial palpitations which negative cardiac workup Medicines:                Monitored Anesthesia Care Procedure:                Pre-Anesthesia Assessment:                           - Prior to the procedure, a History and Physical                            was performed, and patient medications and                            allergies were reviewed. The patient's tolerance of                            previous anesthesia was also reviewed. The risks                            and benefits of the procedure and the sedation                            options and risks were discussed with the patient.                            All questions were answered, and informed consent                            was obtained. Prior Anticoagulants: The patient has                            taken no anticoagulant or antiplatelet agents. ASA                            Grade Assessment: II - A patient with mild systemic  disease. After reviewing the risks and benefits,                            the patient was deemed in satisfactory condition to                            undergo the procedure.                           After obtaining informed consent, the endoscope was                            passed under direct vision. Throughout the                             procedure, the patient's blood pressure, pulse, and                            oxygen saturations were monitored continuously. The                            Olympus Scope [id number] was introduced through the                            mouth, and advanced to the second part of duodenum.                            The upper GI endoscopy was accomplished without                            difficulty. The patient tolerated the procedure                            well. Scope In: Scope Out: Findings:                 Esophagogastric landmarks were identified: the                            Z-line was found at 38 cm, the gastroesophageal                            junction was found at 38 cm and the upper extent of                            the gastric folds was found at 38 cm from the                            incisors.                           The exam of the esophagus was otherwise normal. No                            focal stenosis /  stricture noted.                           Patchy mildly erythematous mucosa was found in the                            gastric antrum.                           The exam of the stomach was otherwise normal.                           Biopsies were taken with a cold forceps for                            Helicobacter pylori testing.                           The examined duodenum was normal. Complications:            No immediate complications. Estimated blood loss:                            Minimal. Estimated Blood Loss:     Estimated blood loss was minimal. Impression:               - Esophagogastric landmarks identified.                           - Normal esophagus otherwise.                           - Erythematous mucosa in the antrum.                           - Normal stomach otherwise - biopsies taken to rule                            out H pylori.                           - Normal examined duodenum. Recommendation:           - Patient has a  contact number available for                            emergencies. The signs and symptoms of potential                            delayed complications were discussed with the                            patient. Return to normal activities tomorrow.                            Written discharge instructions were provided to the  patient.                           - Resume previous diet.                           - Continue present medications.                           - Trial of omeprazole 20mg  / day for 30 days - see                            if this helps treat any component of dyspepsia                            better than pepcid                           - Await pathology results. Viviann Spare P. Emmery Seiler, MD 02/17/2023 2:19:02 PM This report has been signed electronically.

## 2023-02-17 NOTE — Progress Notes (Signed)
Called to room to assist during endoscopic procedure.  Patient ID and intended procedure confirmed with present staff. Received instructions for my participation in the procedure from the performing physician.  

## 2023-02-17 NOTE — Progress Notes (Signed)
Report given to PACU, vss 

## 2023-02-17 NOTE — Progress Notes (Signed)
1338 Robinul 0.1 mg IV given due large amount of secretions upon assessment.  MD made aware, vss

## 2023-02-17 NOTE — Telephone Encounter (Signed)
LM to return my call. 

## 2023-02-17 NOTE — Patient Instructions (Addendum)
Continue present medications. Trial of omeprazole 20mg  / day for 30 days - see if this helps treat any component of dyspepsia better than pepcid Await pathology results. Anticipate repeat colonoscopy in 7 years                             YOU HAD AN ENDOSCOPIC PROCEDURE TODAY AT THE [facility]:   Refer to the procedure report that was given to you for any specific questions about what was found during the examination.  If the procedure report does not answer your questions, please call your gastroenterologist to clarify.  If you requested that your care partner not be given the details of your procedure findings, then the procedure report has been included in a sealed envelope for you to review at your convenience later.  YOU SHOULD EXPECT: Some feelings of bloating in the abdomen. Passage of more gas than usual.  Walking can help get rid of the air that was put into your GI tract during the procedure and reduce the bloating. If you had a lower endoscopy (such as a colonoscopy or flexible sigmoidoscopy) you may notice spotting of blood in your stool or on the toilet paper. If you underwent a bowel prep for your procedure, you may not have a normal bowel movement for a few days.  Please Note:  You might notice some irritation and congestion in your nose or some drainage.  This is from the oxygen used during your procedure.  There is no need for concern and it should clear up in a day or so.  SYMPTOMS TO REPORT IMMEDIATELY:  Following lower endoscopy (colonoscopy or flexible sigmoidoscopy):  Excessive amounts of blood in the stool  Significant tenderness or worsening of abdominal pains  Swelling of the abdomen that is new, acute  Fever of 100F or higher  Following upper endoscopy (EGD)  Vomiting of blood or coffee ground material  New chest pain or pain under the shoulder blades  Painful or persistently difficult swallowing  New shortness of breath  Fever of 100F or  higher  Black, tarry-looking stools  For urgent or emergent issues, a gastroenterologist can be reached at any hour by calling [phone]. Do not use MyChart messaging for urgent concerns.    DIET:  We do recommend a small meal at first, but then you may proceed to your regular diet.  Drink plenty of fluids but you should avoid alcoholic beverages for 24 hours.  ACTIVITY:  You should plan to take it easy for the rest of today and you should NOT DRIVE or use heavy machinery until tomorrow (because of the sedation medicines used during the test).    FOLLOW UP: Our staff will call the number listed on your records the next business day following your procedure.  We will call around 7:15- 8:00 am to check on you and address any questions or concerns that you may have regarding the information given to you following your procedure. If we do not reach you, we will leave a message.     If any biopsies were taken you will be contacted by phone or by letter within the next 1-3 weeks.  Please call us at [phone] if you have not heard about the biopsies in 3 weeks.    SIGNATURES/CONFIDENTIALITY: You and/or your care partner have signed paperwork which will be entered into your electronic medical record.  These signatures attest to the fact that  that the information above on your After Visit Summary has been reviewed and is understood.  Full responsibility of the confidentiality of this discharge information lies with you and/or your care-partner.

## 2023-02-18 ENCOUNTER — Other Ambulatory Visit (HOSPITAL_BASED_OUTPATIENT_CLINIC_OR_DEPARTMENT_OTHER): Payer: Self-pay

## 2023-02-18 ENCOUNTER — Telehealth: Payer: Self-pay

## 2023-02-18 MED ORDER — METOPROLOL SUCCINATE ER 50 MG PO TB24
75.0000 mg | ORAL_TABLET | Freq: Every day | ORAL | 2 refills | Status: DC
  Filled 2023-02-18: qty 45, 30d supply, fill #0
  Filled 2023-03-15: qty 45, 30d supply, fill #1
  Filled 2023-04-19: qty 45, 30d supply, fill #2

## 2023-02-18 NOTE — Addendum Note (Signed)
Addended by: Heywood Bene on: 02/18/2023 12:00 PM   Modules accepted: Orders

## 2023-02-18 NOTE — Telephone Encounter (Signed)
Pt was returning call to CMA regarding her results and is requesting a callback. Please advise

## 2023-02-18 NOTE — Telephone Encounter (Signed)
Spoke with patient, aware of results, and recommendations, lab order on file. She also said  every day she want to start on the Metoprolol Succ 75mg  every day that was recommended on 01/12/2023. Medication sent to pharmacy of choice.

## 2023-02-18 NOTE — Telephone Encounter (Signed)
  Follow up Call-     02/17/2023    1:24 PM  Call back number  Post procedure Call Back phone  # [phone]  Permission to leave phone message Yes     Patient questions:  Do you have a fever, pain , or abdominal swelling? No. Pain Score  0 *  Have you tolerated food without any problems? Yes.    Have you been able to return to your normal activities? Yes.    Do you have any questions about your discharge instructions: Diet   No. Medications  No. Follow up visit  No.  Do you have questions or concerns about your Care? No.  Actions: * If pain score is 4 or above: No action needed, pain <4.

## 2023-02-19 ENCOUNTER — Other Ambulatory Visit (HOSPITAL_BASED_OUTPATIENT_CLINIC_OR_DEPARTMENT_OTHER): Payer: Self-pay

## 2023-02-20 DIAGNOSIS — G4733 Obstructive sleep apnea (adult) (pediatric): Secondary | ICD-10-CM | POA: Diagnosis not present

## 2023-02-23 LAB — SURGICAL PATHOLOGY

## 2023-02-25 ENCOUNTER — Other Ambulatory Visit (HOSPITAL_BASED_OUTPATIENT_CLINIC_OR_DEPARTMENT_OTHER): Payer: Self-pay

## 2023-02-25 ENCOUNTER — Other Ambulatory Visit: Payer: Self-pay | Admitting: *Deleted

## 2023-02-25 DIAGNOSIS — A048 Other specified bacterial intestinal infections: Secondary | ICD-10-CM

## 2023-02-25 MED ORDER — DOXYCYCLINE HYCLATE 100 MG PO TABS
100.0000 mg | ORAL_TABLET | Freq: Two times a day (BID) | ORAL | 0 refills | Status: AC
Start: 2023-02-25 — End: 2023-03-11
  Filled 2023-02-25: qty 28, 14d supply, fill #0

## 2023-02-25 MED ORDER — METRONIDAZOLE 500 MG PO TABS
250.0000 mg | ORAL_TABLET | Freq: Four times a day (QID) | ORAL | 0 refills | Status: AC
Start: 2023-02-25 — End: 2023-03-11
  Filled 2023-02-25: qty 28, 14d supply, fill #0

## 2023-02-25 MED ORDER — BISMUTH SUBSALICYLATE 262 MG PO CHEW
524.0000 mg | CHEWABLE_TABLET | Freq: Four times a day (QID) | ORAL | 0 refills | Status: AC
Start: 2023-02-25 — End: 2023-03-15
  Filled 2023-02-25: qty 90, 12d supply, fill #1
  Filled 2023-02-25: qty 30, 4d supply, fill #0

## 2023-02-26 ENCOUNTER — Other Ambulatory Visit (HOSPITAL_BASED_OUTPATIENT_CLINIC_OR_DEPARTMENT_OTHER): Payer: Self-pay

## 2023-03-02 DIAGNOSIS — G4733 Obstructive sleep apnea (adult) (pediatric): Secondary | ICD-10-CM | POA: Diagnosis not present

## 2023-03-03 ENCOUNTER — Other Ambulatory Visit (HOSPITAL_BASED_OUTPATIENT_CLINIC_OR_DEPARTMENT_OTHER): Payer: Self-pay

## 2023-03-03 ENCOUNTER — Encounter: Payer: Self-pay | Admitting: *Deleted

## 2023-03-03 MED ORDER — FLUAD 0.5 ML IM SUSY
PREFILLED_SYRINGE | INTRAMUSCULAR | 0 refills | Status: DC
  Filled 2023-03-03: qty 0.5, 1d supply, fill #0

## 2023-03-04 LAB — BASIC METABOLIC PANEL
BUN/Creatinine Ratio: 18 (ref 12–28)
BUN: 20 mg/dL (ref 8–27)
CO2: 23 mmol/L (ref 20–29)
Calcium: 9.8 mg/dL (ref 8.7–10.3)
Chloride: 103 mmol/L (ref 96–106)
Creatinine, Ser: 1.1 mg/dL — ABNORMAL HIGH (ref 0.57–1.00)
Glucose: 95 mg/dL (ref 70–99)
Potassium: 5 mmol/L (ref 3.5–5.2)
Sodium: 141 mmol/L (ref 134–144)
eGFR: 55 mL/min/{1.73_m2} — ABNORMAL LOW (ref >59)

## 2023-03-10 ENCOUNTER — Other Ambulatory Visit (HOSPITAL_BASED_OUTPATIENT_CLINIC_OR_DEPARTMENT_OTHER): Payer: Self-pay

## 2023-03-15 ENCOUNTER — Other Ambulatory Visit: Payer: Self-pay

## 2023-03-17 ENCOUNTER — Other Ambulatory Visit: Payer: Self-pay

## 2023-03-17 ENCOUNTER — Ambulatory Visit: Payer: Commercial Managed Care - PPO

## 2023-03-19 ENCOUNTER — Telehealth: Payer: Self-pay | Admitting: Cardiology

## 2023-03-19 NOTE — Telephone Encounter (Signed)
Pt c/o medication issue:  1. Name of Medication: metoprolol succinate (TOPROL-XL) 50 MG 24 hr tablet   2. How are you currently taking this medication (dosage and times per day)?   3. Are you having a reaction (difficulty breathing--STAT)?   4. What is your medication issue? Patient is requesting call back to see if she can take this medication at a lower dose. Please advise.

## 2023-03-19 NOTE — Telephone Encounter (Signed)
Pt wants to see if she can get metoprolol succinate (TOPROL-XL) 1.5 tab (75mg  total daily) prescribed differently. Reports that it is difficult to cut pills and would like to take three 25mg  tablet instead. Informed pt this could increase her med cost due to more pills  but would forward request to Dr. Kirtland Bouchard. Recommended pt get a pill cutter. Pt verbalized understanding, no further questions at this time.

## 2023-03-26 ENCOUNTER — Other Ambulatory Visit (HOSPITAL_BASED_OUTPATIENT_CLINIC_OR_DEPARTMENT_OTHER): Payer: Self-pay

## 2023-03-26 ENCOUNTER — Encounter: Payer: Self-pay | Admitting: Gastroenterology

## 2023-03-29 ENCOUNTER — Other Ambulatory Visit: Payer: Self-pay

## 2023-04-01 DIAGNOSIS — I10 Essential (primary) hypertension: Secondary | ICD-10-CM | POA: Diagnosis not present

## 2023-04-01 DIAGNOSIS — E782 Mixed hyperlipidemia: Secondary | ICD-10-CM | POA: Diagnosis not present

## 2023-04-01 DIAGNOSIS — D649 Anemia, unspecified: Secondary | ICD-10-CM | POA: Diagnosis not present

## 2023-04-01 DIAGNOSIS — E039 Hypothyroidism, unspecified: Secondary | ICD-10-CM | POA: Diagnosis not present

## 2023-04-01 DIAGNOSIS — R7303 Prediabetes: Secondary | ICD-10-CM | POA: Diagnosis not present

## 2023-04-01 DIAGNOSIS — G4733 Obstructive sleep apnea (adult) (pediatric): Secondary | ICD-10-CM | POA: Diagnosis not present

## 2023-04-01 DIAGNOSIS — K219 Gastro-esophageal reflux disease without esophagitis: Secondary | ICD-10-CM | POA: Diagnosis not present

## 2023-04-02 ENCOUNTER — Other Ambulatory Visit (HOSPITAL_BASED_OUTPATIENT_CLINIC_OR_DEPARTMENT_OTHER): Payer: Self-pay

## 2023-04-09 ENCOUNTER — Other Ambulatory Visit: Payer: Self-pay | Admitting: Obstetrics and Gynecology

## 2023-04-09 ENCOUNTER — Other Ambulatory Visit (HOSPITAL_BASED_OUTPATIENT_CLINIC_OR_DEPARTMENT_OTHER): Payer: Self-pay

## 2023-04-09 ENCOUNTER — Encounter: Payer: Self-pay | Admitting: Obstetrics and Gynecology

## 2023-04-09 ENCOUNTER — Ambulatory Visit: Payer: Commercial Managed Care - PPO | Admitting: Obstetrics and Gynecology

## 2023-04-09 VITALS — BP 123/57 | HR 52 | Wt 212.0 lb

## 2023-04-09 DIAGNOSIS — N644 Mastodynia: Secondary | ICD-10-CM

## 2023-04-09 MED ORDER — DICLOFENAC SODIUM 1 % EX GEL
2.0000 g | Freq: Four times a day (QID) | CUTANEOUS | 3 refills | Status: DC
Start: 2023-04-09 — End: 2023-06-22
  Filled 2023-04-09: qty 100, 25d supply, fill #0
  Filled 2023-04-27 – 2023-05-03 (×2): qty 100, 25d supply, fill #1

## 2023-04-09 NOTE — Progress Notes (Signed)
   GYNECOLOGY OFFICE VISIT NOTE  History:   Janet Phelps is a 67 y.o. [id number] here today for tingling/discomfort in left breast. It has been like this more recently  She has had left breast pain for some years. She has not yet tried anything for it.   No nipple discharge  The following portions of the patient's history were reviewed and updated as appropriate: allergies, current medications, past family history, past medical history, past social history, past surgical history and problem list.   Review of Systems:  Pertinent items noted in HPI and remainder of comprehensive ROS otherwise negative.  Physical Exam:  BP (!) 123/57   Pulse (!) 52   Wt 212 lb (96.2 kg)   BMI 35.28 kg/m  CONSTITUTIONAL: Well-developed, well-nourished female in no acute distress.  HEENT:  Normocephalic, atraumatic. External right and left ear normal. No scleral icterus.  NECK: Normal range of motion, supple, no masses noted on observation SKIN: No rash noted. Not diaphoretic. No erythema. No pallor. MUSCULOSKELETAL: Normal range of motion. No edema noted. NEUROLOGIC: Alert and oriented to person, place, and time. Normal muscle tone coordination. No cranial nerve deficit noted. PSYCHIATRIC: Normal mood and affect. Normal behavior. Normal judgment and thought content.  Breasts: breasts appear normal, no suspicious masses, no skin or nipple changes or axillary nodes.   Labs and Imaging No results found for this or any previous visit (from the past 168 hour(s)). No results found.  Assessment and Plan:   1. Mastodynia - Diffuse breast pain is a common condition, and rarely a sign of breast cancer. It can be exacerbated by hormonal changes, hormonal medications, caffeine, and weight changes. It can be improved by wearing adequate support, over-the-counter pain medication, low-fat diet, exercise, and ice as needed. Topical medications containing diclofenac or lidocaine may provide temporary pain  relief. Studies have shown an improvement in cyclic pain with use of evening primrose oil and vitamin E. Often breast pain will resolve on its own without intervention. - Reviewed [facility] and it is negativr for breast cancer - Discussed the option for observation versus imaging - patient would like imaging -     diclofenac Sodium (VOLTAREN) 1 % GEL; Apply 2 g topically 4 (four) times daily. -     [place] LIMITED ULTRASOUND INCLUDING AXILLA LEFT BREAST ; Future -     MM Digital Diagnostic Unilat L; Future   Meds ordered this encounter  Medications   diclofenac Sodium (VOLTAREN) 1 % GEL    Sig: Apply 2 g topically 4 (four) times daily.    Dispense:  50 g    Refill:  3     Routine preventative health maintenance measures emphasized. Please refer to After Visit Summary for other counseling recommendations.   Return in about 6 months (around 10/07/2023) for annual.  Milas Hock, MD, FACOG Obstetrician & Gynecologist, [facility], [facility]

## 2023-04-09 NOTE — Patient Instructions (Addendum)
Other things may help:  - Low fat - Elimination of caffiene - Vitamin E - Chamomile extract - Evening primrose oil  If still continued discomfort, could see a breast surgeon.

## 2023-04-09 NOTE — Progress Notes (Signed)
Pt complains of tingling/burning sensation in L breast.   Pt has also noticed some discoloration. Pt denies discharge from breast. Had mammo 07/2022.

## 2023-04-12 ENCOUNTER — Other Ambulatory Visit: Payer: Self-pay

## 2023-04-15 ENCOUNTER — Other Ambulatory Visit (HOSPITAL_BASED_OUTPATIENT_CLINIC_OR_DEPARTMENT_OTHER): Payer: Self-pay

## 2023-04-15 MED ORDER — PNEUMOCOCCAL 20-VAL CONJ VACC 0.5 ML IM SUSY
0.5000 mL | PREFILLED_SYRINGE | Freq: Once | INTRAMUSCULAR | 0 refills | Status: AC
  Filled 2023-04-15: qty 0.5, 1d supply, fill #0

## 2023-04-19 ENCOUNTER — Other Ambulatory Visit: Payer: Self-pay

## 2023-04-23 ENCOUNTER — Ambulatory Visit
Admission: RE | Admit: 2023-04-23 | Discharge: 2023-04-23 | Disposition: A | Discharge status: Home / Self Care | Payer: Commercial Managed Care - PPO | Source: Ambulatory Visit | Attending: Obstetrics and Gynecology | Admitting: Obstetrics and Gynecology

## 2023-04-23 ENCOUNTER — Other Ambulatory Visit: Payer: Self-pay | Admitting: Obstetrics and Gynecology

## 2023-04-23 ENCOUNTER — Ambulatory Visit: Admission: RE | Admit: 2023-04-23 | Payer: Commercial Managed Care - PPO | Source: Ambulatory Visit

## 2023-04-23 DIAGNOSIS — R921 Mammographic calcification found on diagnostic imaging of breast: Secondary | ICD-10-CM | POA: Diagnosis not present

## 2023-04-23 DIAGNOSIS — N644 Mastodynia: Secondary | ICD-10-CM

## 2023-04-27 ENCOUNTER — Other Ambulatory Visit: Payer: Self-pay

## 2023-04-27 ENCOUNTER — Other Ambulatory Visit (HOSPITAL_BASED_OUTPATIENT_CLINIC_OR_DEPARTMENT_OTHER): Payer: Self-pay

## 2023-04-28 ENCOUNTER — Telehealth: Payer: Self-pay | Admitting: Cardiology

## 2023-04-28 NOTE — Telephone Encounter (Signed)
Patient called stating that her heart rate is dropping to 47 but her blood pressure is 140 over 90. She concerned and thinks her medications needs to be reevaluated. CB # [phone]

## 2023-04-29 ENCOUNTER — Other Ambulatory Visit (HOSPITAL_BASED_OUTPATIENT_CLINIC_OR_DEPARTMENT_OTHER): Payer: Self-pay

## 2023-04-29 ENCOUNTER — Telehealth: Payer: Self-pay

## 2023-04-29 MED ORDER — AMLODIPINE BESYLATE 5 MG PO TABS
5.0000 mg | ORAL_TABLET | Freq: Every day | ORAL | 1 refills | Status: DC
  Filled 2023-04-29: qty 90, 90d supply, fill #0
  Filled 2023-07-21: qty 90, 90d supply, fill #1

## 2023-04-29 MED ORDER — METOPROLOL SUCCINATE ER 50 MG PO TB24
50.0000 mg | ORAL_TABLET | Freq: Every day | ORAL | 2 refills | Status: DC
  Filled 2023-04-29 – 2023-05-13 (×10): qty 90, 90d supply, fill #0
  Filled 2023-08-04: qty 90, 90d supply, fill #1
  Filled 2023-11-02: qty 90, 90d supply, fill #2

## 2023-04-29 NOTE — Telephone Encounter (Signed)
Spoke with pt regarding pulse rate being low. Dr. Bing Matter recommends that she decrease her Metoprolol to 50mg  daily. Pt verbalized understanding and had no further questions.

## 2023-04-30 ENCOUNTER — Other Ambulatory Visit (HOSPITAL_BASED_OUTPATIENT_CLINIC_OR_DEPARTMENT_OTHER): Payer: Self-pay

## 2023-05-02 DIAGNOSIS — G4733 Obstructive sleep apnea (adult) (pediatric): Secondary | ICD-10-CM | POA: Diagnosis not present

## 2023-05-03 ENCOUNTER — Other Ambulatory Visit (HOSPITAL_BASED_OUTPATIENT_CLINIC_OR_DEPARTMENT_OTHER): Payer: Self-pay

## 2023-05-03 ENCOUNTER — Other Ambulatory Visit: Payer: Self-pay

## 2023-05-03 MED ORDER — LEVOTHYROXINE SODIUM 50 MCG PO TABS
50.0000 ug | ORAL_TABLET | Freq: Every day | ORAL | 1 refills | Status: DC
  Filled 2023-05-03: qty 90, 90d supply, fill #0
  Filled 2023-07-26: qty 90, 90d supply, fill #1

## 2023-05-03 MED ORDER — LISINOPRIL 20 MG PO TABS
20.0000 mg | ORAL_TABLET | Freq: Every day | ORAL | 1 refills | Status: DC
  Filled 2023-05-03: qty 90, 90d supply, fill #0
  Filled 2023-07-22: qty 90, 90d supply, fill #1

## 2023-05-04 ENCOUNTER — Other Ambulatory Visit (HOSPITAL_BASED_OUTPATIENT_CLINIC_OR_DEPARTMENT_OTHER): Payer: Self-pay

## 2023-05-04 ENCOUNTER — Other Ambulatory Visit (HOSPITAL_COMMUNITY): Payer: Self-pay

## 2023-05-04 ENCOUNTER — Other Ambulatory Visit: Payer: Self-pay

## 2023-05-04 MED ORDER — HYDROCHLOROTHIAZIDE 12.5 MG PO TABS
12.5000 mg | ORAL_TABLET | Freq: Every day | ORAL | 1 refills | Status: DC
  Filled 2023-05-04: qty 90, 90d supply, fill #0
  Filled 2023-07-26: qty 90, 90d supply, fill #1

## 2023-05-05 ENCOUNTER — Other Ambulatory Visit (HOSPITAL_BASED_OUTPATIENT_CLINIC_OR_DEPARTMENT_OTHER): Payer: Self-pay

## 2023-05-07 ENCOUNTER — Other Ambulatory Visit (HOSPITAL_BASED_OUTPATIENT_CLINIC_OR_DEPARTMENT_OTHER): Payer: Self-pay

## 2023-05-10 ENCOUNTER — Other Ambulatory Visit: Payer: Self-pay

## 2023-05-11 ENCOUNTER — Other Ambulatory Visit (HOSPITAL_BASED_OUTPATIENT_CLINIC_OR_DEPARTMENT_OTHER): Payer: Self-pay

## 2023-05-12 ENCOUNTER — Other Ambulatory Visit (HOSPITAL_BASED_OUTPATIENT_CLINIC_OR_DEPARTMENT_OTHER): Payer: Self-pay

## 2023-05-12 DIAGNOSIS — E039 Hypothyroidism, unspecified: Secondary | ICD-10-CM | POA: Diagnosis not present

## 2023-05-12 DIAGNOSIS — I10 Essential (primary) hypertension: Secondary | ICD-10-CM | POA: Diagnosis not present

## 2023-05-12 DIAGNOSIS — E782 Mixed hyperlipidemia: Secondary | ICD-10-CM | POA: Diagnosis not present

## 2023-05-12 DIAGNOSIS — D649 Anemia, unspecified: Secondary | ICD-10-CM | POA: Diagnosis not present

## 2023-05-12 DIAGNOSIS — K219 Gastro-esophageal reflux disease without esophagitis: Secondary | ICD-10-CM | POA: Diagnosis not present

## 2023-05-12 DIAGNOSIS — R7303 Prediabetes: Secondary | ICD-10-CM | POA: Diagnosis not present

## 2023-05-13 ENCOUNTER — Other Ambulatory Visit (HOSPITAL_BASED_OUTPATIENT_CLINIC_OR_DEPARTMENT_OTHER): Payer: Self-pay

## 2023-05-19 ENCOUNTER — Other Ambulatory Visit (HOSPITAL_BASED_OUTPATIENT_CLINIC_OR_DEPARTMENT_OTHER): Payer: Self-pay

## 2023-05-20 ENCOUNTER — Other Ambulatory Visit (HOSPITAL_BASED_OUTPATIENT_CLINIC_OR_DEPARTMENT_OTHER): Payer: Self-pay

## 2023-05-21 DIAGNOSIS — G4733 Obstructive sleep apnea (adult) (pediatric): Secondary | ICD-10-CM | POA: Diagnosis not present

## 2023-05-27 ENCOUNTER — Other Ambulatory Visit (HOSPITAL_BASED_OUTPATIENT_CLINIC_OR_DEPARTMENT_OTHER): Payer: Self-pay

## 2023-05-27 DIAGNOSIS — E782 Mixed hyperlipidemia: Secondary | ICD-10-CM | POA: Diagnosis not present

## 2023-05-27 DIAGNOSIS — E039 Hypothyroidism, unspecified: Secondary | ICD-10-CM | POA: Diagnosis not present

## 2023-05-27 DIAGNOSIS — R7303 Prediabetes: Secondary | ICD-10-CM | POA: Diagnosis not present

## 2023-05-27 DIAGNOSIS — I10 Essential (primary) hypertension: Secondary | ICD-10-CM | POA: Diagnosis not present

## 2023-05-27 DIAGNOSIS — D649 Anemia, unspecified: Secondary | ICD-10-CM | POA: Diagnosis not present

## 2023-05-27 DIAGNOSIS — K219 Gastro-esophageal reflux disease without esophagitis: Secondary | ICD-10-CM | POA: Diagnosis not present

## 2023-05-27 MED ORDER — CIPROFLOXACIN HCL 250 MG PO TABS
250.0000 mg | ORAL_TABLET | Freq: Two times a day (BID) | ORAL | 0 refills | Status: DC | PRN
  Filled 2023-05-27 – 2023-06-11 (×2): qty 20, 10d supply, fill #0

## 2023-05-27 MED ORDER — MEFLOQUINE HCL 250 MG PO TABS
250.0000 mg | ORAL_TABLET | ORAL | 0 refills | Status: AC
  Filled 2023-05-27 – 2023-05-28 (×2): qty 4, 28d supply, fill #0

## 2023-05-27 MED ORDER — METRONIDAZOLE 500 MG PO TABS
500.0000 mg | ORAL_TABLET | Freq: Three times a day (TID) | ORAL | 0 refills | Status: DC | PRN
  Filled 2023-05-27 – 2023-06-11 (×2): qty 30, 10d supply, fill #0

## 2023-05-28 ENCOUNTER — Other Ambulatory Visit (HOSPITAL_BASED_OUTPATIENT_CLINIC_OR_DEPARTMENT_OTHER): Payer: Self-pay

## 2023-06-01 DIAGNOSIS — G4733 Obstructive sleep apnea (adult) (pediatric): Secondary | ICD-10-CM | POA: Diagnosis not present

## 2023-06-07 ENCOUNTER — Other Ambulatory Visit: Payer: Self-pay

## 2023-06-07 ENCOUNTER — Other Ambulatory Visit (HOSPITAL_BASED_OUTPATIENT_CLINIC_OR_DEPARTMENT_OTHER): Payer: Self-pay

## 2023-06-07 ENCOUNTER — Other Ambulatory Visit: Payer: Self-pay | Admitting: Gastroenterology

## 2023-06-07 DIAGNOSIS — K219 Gastro-esophageal reflux disease without esophagitis: Secondary | ICD-10-CM

## 2023-06-07 DIAGNOSIS — K319 Disease of stomach and duodenum, unspecified: Secondary | ICD-10-CM

## 2023-06-07 DIAGNOSIS — R1011 Right upper quadrant pain: Secondary | ICD-10-CM

## 2023-06-07 MED ORDER — OMEPRAZOLE 20 MG PO CPDR
20.0000 mg | DELAYED_RELEASE_CAPSULE | Freq: Every day | ORAL | 3 refills | Status: DC
  Filled 2023-06-07: qty 30, 30d supply, fill #0
  Filled 2023-07-07 – 2023-07-20 (×2): qty 30, 30d supply, fill #1
  Filled 2023-08-16: qty 30, 30d supply, fill #2
  Filled 2023-09-13: qty 30, 30d supply, fill #3

## 2023-06-11 ENCOUNTER — Other Ambulatory Visit (HOSPITAL_BASED_OUTPATIENT_CLINIC_OR_DEPARTMENT_OTHER): Payer: Self-pay

## 2023-06-23 ENCOUNTER — Encounter: Payer: Self-pay | Admitting: Cardiology

## 2023-06-23 ENCOUNTER — Ambulatory Visit: Payer: Commercial Managed Care - PPO | Attending: Cardiology | Admitting: Cardiology

## 2023-06-23 VITALS — BP 136/74 | HR 63 | Ht 64.0 in | Wt 211.0 lb

## 2023-06-23 DIAGNOSIS — I493 Ventricular premature depolarization: Secondary | ICD-10-CM | POA: Diagnosis not present

## 2023-06-23 DIAGNOSIS — E7800 Pure hypercholesterolemia, unspecified: Secondary | ICD-10-CM

## 2023-06-23 DIAGNOSIS — R002 Palpitations: Secondary | ICD-10-CM

## 2023-06-23 DIAGNOSIS — I10 Essential (primary) hypertension: Secondary | ICD-10-CM

## 2023-06-23 DIAGNOSIS — I517 Cardiomegaly: Secondary | ICD-10-CM | POA: Diagnosis not present

## 2023-06-23 NOTE — Progress Notes (Signed)
 Cardiology Office Note:    Date:  06/23/2023   ID:  Janet Phelps, Janet Phelps 02/10/1956, MRN [id number]  PCP:  Tretha Fu, MD  Cardiologist:  Ralene Burger, MD    Referring MD: Tretha Fu, MD   Chief Complaint  Patient presents with   Palpitations    History of Present Illness:    Janet Phelps is a 68 y.o. female past medical history significant for essential hypertension, palpitations, anemia, diabetes, dyslipidemia.  Comes today to months to discuss her issues she still gets some PVCs palpitations bother her somewhat but overall she says she can tolerate this.  She takes 50 mg of metoprolol  succinate seems to be helping quite a lot.  She is planning to go to [place] for 2 weeks and she is planning to make some changes in her diet also seeing hematologist for potentially doing something to improve her anemia hopefully that will make her feel better.  Otherwise she can walk climb stairs with no difficulties  Past Medical History:  Diagnosis Date   Abnormal blood chemistry 07/18/2013   Abnormal urine 01/04/2017   Anemia 05/09/2018   Chronic RLQ pain 01/04/2017   Costovertebral angle tenderness 01/04/2017   Diabetes mellitus (HCC)    History of gestational diabetes 05/09/2012   GDM 1 pregnancy on  insulin   History of Helicobacter pylori infection 06/25/2017   HTN (hypertension)    Hypercholesterolemia 07/18/2013   Hypokalemia 02/28/2014   Insomnia 07/18/2013   Mastodynia 06/06/2013   Nausea 06/04/2015   Newly recognized heart murmur 12/22/2013   OSA on CPAP    Other benign mammary dysplasias of right breast 01/14/2015   Right flank pain 01/04/2017   Sciatica    Screening for colon cancer 01/14/2015   Seasonal allergies 06/04/2015   Sensation of lump in throat 06/04/2015   Severe obesity (BMI 35.0-39.9) with comorbidity (HCC) 06/25/2017   Snoring 12/23/2016   Vaginal pain 01/14/2015   Vitamin D deficiency 07/23/2017    Past Surgical  History:  Procedure Laterality Date   CESAREAN SECTION     x3   CHOLECYSTECTOMY     COLPOSCOPY     LUMBAR LAMINECTOMY     TONSILLECTOMY      Current Medications: Current Meds  Medication Sig   amLODipine  (NORVASC ) 5 MG tablet Take 1 tablet (5 mg total) by mouth daily.   Ascorbic Acid (VITAMIN C PO) Take 1 tablet by mouth daily.   aspirin 81 MG tablet Take 81 mg by mouth 2 (two) times a week.   B Complex Vitamins (VITAMIN-B COMPLEX PO) Take 1 tablet by mouth daily.   ciprofloxacin  (CIPRO ) 250 MG tablet Take 1 tablet (250 mg total) by mouth every 12 (twelve) hours as needed for 10 days (Patient taking differently: Take 250 mg by mouth every 12 (twelve) hours as needed (Infection).)   ferrous sulfate  325 (65 FE) MG tablet Take 1 tablet (325 mg total) by mouth every Monday, Wednesday, and Friday.   hydrochlorothiazide  (HYDRODIURIL ) 12.5 MG tablet Take 1 tablet (12.5 mg total) by mouth daily.   influenza vaccine adjuvanted (FLUAD) 0.5 ML injection Inject into the muscle. (Patient taking differently: Inject 0.5 mLs into the muscle once.)   levothyroxine  (SYNTHROID ) 50 MCG tablet Take 1 tablet (50 mcg total) by mouth daily.   lisinopril  (ZESTRIL ) 20 MG tablet Take 1 tablet (20 mg total) by mouth daily.   lisinopril -hydrochlorothiazide  (ZESTORETIC ) 20-12.5 MG tablet Take 1 tablet by mouth daily.   mefloquine  (LARIAM ) 250 MG tablet Take  1 tablet (250 mg total) by mouth once a week.   metoprolol  succinate (TOPROL -XL) 50 MG 24 hr tablet Take 1 tablet (50 mg total) by mouth daily. Take with or immediately following a meal.   metroNIDAZOLE  (FLAGYL ) 500 MG tablet Take 1 tablet (500 mg total) by mouth 3 (three) times daily as needed for 10 days (Patient taking differently: Take 500 mg by mouth 3 (three) times daily as needed (Yeast).)   omeprazole  (PRILOSEC) 20 MG capsule Take 1 capsule (20 mg total) by mouth daily.   Potassium Chloride  ER 20 MEQ TBCR Take 1 tablet (20 mEq total) by mouth 3 (three)  times daily.   spironolactone  (ALDACTONE ) 25 MG tablet Take 1 tablet (25 mg total) by mouth daily.     Allergies:   Labetalol and Benazepril    Social History   Socioeconomic History   Marital status: Single    Spouse name: Not on file   Number of children: 4   Years of education: Not on file   Highest education level: Not on file  Occupational History   Not on file  Tobacco Use   Smoking status: Never   Smokeless tobacco: Never  Vaping Use   Vaping status: Never Used  Substance and Sexual Activity   Alcohol use: Yes    Comment: OCC   Drug use: No   Sexual activity: Never  Other Topics Concern   Not on file  Social History Narrative   Not on file   Social Drivers of Health   Financial Resource Strain: Low Risk  (07/01/2022)   Received from [facility], [facility]   Overall Financial Resource Strain (CARDIA)    Difficulty of Paying Living Expenses: Not hard at all  Food Insecurity: No Food Insecurity (07/01/2022)   Received from [facility], [facility]   Hunger Vital Sign    Worried About Running Out of Food in the Last Year: Never true    Ran Out of Food in the Last Year: Never true  Transportation Needs: No Transportation Needs (07/01/2022)   Received from [facility], [facility]   PRAPARE - Transportation    Lack of Transportation (Medical): No    Lack of Transportation (Non-Medical): No  Physical Activity: Insufficiently Active (09/24/2021)   Received from [facility], [facility]   Exercise Vital Sign    Days of Exercise per Week: 1 day    Minutes of Exercise per Session: 20 min  Stress: No Stress Concern Present (09/24/2021)   Received from [facility], [facility] of Occupational Health - Occupational Stress Questionnaire    Feeling of Stress : Not at all  Social Connections: Unknown (09/26/2022)   Received from [facility], [facility]   Social Network    Social Network: Not on file     Family History: The  patient's family history includes Hypertension in her mother, sister, sister, and sister; Stroke in her mother; Thyroid  disease in her mother. There is no history of Colon cancer, Esophageal cancer, Rectal cancer, or Stomach cancer. ROS:   Please see the history of present illness.    All 14 point review of systems negative except as described per history of present illness  EKGs/Labs/Other Studies Reviewed:         Recent Labs: 10/17/2022: B Natriuretic Peptide 52.9; Magnesium 2.1; TSH 6.883 12/04/2022: ALT 11 01/12/2023: Hemoglobin 11.7; Platelets 204.0 03/03/2023: BUN 20; Creatinine, Ser 1.10; Potassium 5.0; Sodium 141  Recent Lipid Panel No results found for: "  CHOL", "TRIG", "HDL", "CHOLHDL", "VLDL", "LDLCALC", "LDLDIRECT"  Physical Exam:    VS:  BP 136/74 (BP Location: Left Arm, Patient Position: Sitting)   Pulse 63   Ht 5\' 4"  (1.626 m)   Wt 211 lb (95.7 kg)   SpO2 99%   BMI 36.22 kg/m     Wt Readings from Last 3 Encounters:  06/23/23 211 lb (95.7 kg)  04/09/23 212 lb (96.2 kg)  02/17/23 213 lb (96.6 kg)     GEN:  Well nourished, well developed in no acute distress HEENT: Normal NECK: No JVD; No carotid bruits LYMPHATICS: No lymphadenopathy CARDIAC: RRR, no murmurs, no rubs, no gallops RESPIRATORY:  Clear to auscultation without rales, wheezing or rhonchi  ABDOMEN: Soft, non-tender, non-distended MUSCULOSKELETAL:  No edema; No deformity  SKIN: Warm and dry LOWER EXTREMITIES: no swelling NEUROLOGIC:  Alert and oriented x 3 PSYCHIATRIC:  Normal affect   ASSESSMENT:    1. Palpitations   2. Primary hypertension   3. Left ventricular hypertrophy   4. PVC's (premature ventricular contractions)   5. Hypercholesterolemia    PLAN:    In order of problems listed above:  Palpitations.  Seems to be reasonably controlled.  All cardiac workup and stratification was negative which is encouraging we will continue present management for now. Essential hypertension blood  pressure well-controlled continue present management. Left atrial hypertrophy.  Noted. Hypercholesterolemia,   Medication Adjustments/Labs and Tests Ordered: Current medicines are reviewed at length with the patient today.  Concerns regarding medicines are outlined above.  Orders Placed This Encounter  Procedures   EKG 12-Lead   Medication changes: No orders of the defined types were placed in this encounter.   Signed, Manfred Seed, MD, [facility] 06/23/2023 2:04 PM    [facility]

## 2023-06-23 NOTE — Patient Instructions (Signed)

## 2023-06-25 ENCOUNTER — Other Ambulatory Visit (HOSPITAL_BASED_OUTPATIENT_CLINIC_OR_DEPARTMENT_OTHER): Payer: Self-pay

## 2023-06-25 MED ORDER — ATOVAQUONE-PROGUANIL HCL 250-100 MG PO TABS
1.0000 | ORAL_TABLET | Freq: Every day | ORAL | 0 refills | Status: AC
  Filled 2023-06-25: qty 10, 10d supply, fill #0

## 2023-06-29 ENCOUNTER — Other Ambulatory Visit (HOSPITAL_BASED_OUTPATIENT_CLINIC_OR_DEPARTMENT_OTHER): Payer: Self-pay

## 2023-06-29 ENCOUNTER — Other Ambulatory Visit (HOSPITAL_COMMUNITY): Payer: Self-pay

## 2023-06-30 ENCOUNTER — Other Ambulatory Visit (HOSPITAL_BASED_OUTPATIENT_CLINIC_OR_DEPARTMENT_OTHER): Payer: Self-pay

## 2023-07-02 DIAGNOSIS — G4733 Obstructive sleep apnea (adult) (pediatric): Secondary | ICD-10-CM | POA: Diagnosis not present

## 2023-07-14 ENCOUNTER — Other Ambulatory Visit (HOSPITAL_BASED_OUTPATIENT_CLINIC_OR_DEPARTMENT_OTHER): Payer: Self-pay

## 2023-07-15 ENCOUNTER — Other Ambulatory Visit (HOSPITAL_BASED_OUTPATIENT_CLINIC_OR_DEPARTMENT_OTHER): Payer: Self-pay

## 2023-07-19 ENCOUNTER — Other Ambulatory Visit (HOSPITAL_BASED_OUTPATIENT_CLINIC_OR_DEPARTMENT_OTHER): Payer: Self-pay

## 2023-07-21 ENCOUNTER — Other Ambulatory Visit (HOSPITAL_BASED_OUTPATIENT_CLINIC_OR_DEPARTMENT_OTHER): Payer: Self-pay

## 2023-07-22 ENCOUNTER — Other Ambulatory Visit (HOSPITAL_BASED_OUTPATIENT_CLINIC_OR_DEPARTMENT_OTHER): Payer: Self-pay

## 2023-07-23 ENCOUNTER — Other Ambulatory Visit: Payer: Self-pay | Admitting: Nurse Practitioner

## 2023-07-23 ENCOUNTER — Other Ambulatory Visit (HOSPITAL_BASED_OUTPATIENT_CLINIC_OR_DEPARTMENT_OTHER): Payer: Self-pay

## 2023-07-23 DIAGNOSIS — D649 Anemia, unspecified: Secondary | ICD-10-CM

## 2023-07-23 MED ORDER — FERROUS SULFATE 325 (65 FE) MG PO TABS: 325.0000 mg | ORAL_TABLET | ORAL | 0 refills | Status: AC

## 2023-07-26 ENCOUNTER — Other Ambulatory Visit: Payer: Self-pay

## 2023-07-28 ENCOUNTER — Other Ambulatory Visit (HOSPITAL_BASED_OUTPATIENT_CLINIC_OR_DEPARTMENT_OTHER): Payer: Self-pay

## 2023-08-02 DIAGNOSIS — G4733 Obstructive sleep apnea (adult) (pediatric): Secondary | ICD-10-CM | POA: Diagnosis not present

## 2023-08-04 ENCOUNTER — Ambulatory Visit: Payer: Commercial Managed Care - PPO | Admitting: Physician Assistant

## 2023-08-04 ENCOUNTER — Encounter: Payer: Self-pay | Admitting: Physician Assistant

## 2023-08-04 VITALS — BP 137/81 | HR 61 | Temp 98.5°F | Ht 65.0 in | Wt 212.1 lb

## 2023-08-04 DIAGNOSIS — M546 Pain in thoracic spine: Secondary | ICD-10-CM | POA: Diagnosis not present

## 2023-08-04 DIAGNOSIS — Z13220 Encounter for screening for lipoid disorders: Secondary | ICD-10-CM

## 2023-08-04 DIAGNOSIS — E039 Hypothyroidism, unspecified: Secondary | ICD-10-CM

## 2023-08-04 DIAGNOSIS — I10 Essential (primary) hypertension: Secondary | ICD-10-CM | POA: Diagnosis not present

## 2023-08-04 DIAGNOSIS — Z862 Personal history of diseases of the blood and blood-forming organs and certain disorders involving the immune mechanism: Secondary | ICD-10-CM | POA: Insufficient documentation

## 2023-08-04 DIAGNOSIS — G8929 Other chronic pain: Secondary | ICD-10-CM

## 2023-08-04 NOTE — Assessment & Plan Note (Signed)
 Repeat iron panel, cbc.  Pt reports she has had a GI w/u that was negative. Reports her pcp's next step was heme referral if still deficient.

## 2023-08-04 NOTE — Assessment & Plan Note (Signed)
 Chronic, well controlled. Managed with amlodipine 5 mg, lisinopril 20 mg, hctz 12.5 mg and spironolactone 25 mg.  Ordered cmp F/u 39mo

## 2023-08-04 NOTE — Assessment & Plan Note (Signed)
 Managed on levothyroxine 50 mcg.  Repeat tsh/t4

## 2023-08-04 NOTE — Assessment & Plan Note (Signed)
 Likely musculoskeletal. Recommended some exercises/stretches during pain flares

## 2023-08-04 NOTE — Progress Notes (Signed)
 New patient visit   Patient: Janet Phelps   DOB: [birth year]   68 y.o. Female  MRN: [id number] Visit Date: 08/04/2023  Today's healthcare provider: Alfredia Ferguson, PA-C   Cc. New patient establish care.  Subjective    Janet Phelps is a 68 y.o. female who presents today as a new patient to establish care.   Discussed the use of AI scribe software for clinical note transcription with the patient, who gave verbal consent to proceed.  History of Present Illness   The patient, with a history of hypertension, iron deficiency, and possible anemia, presents for a new primary care provider visit.   The patient reports taking multiple medications for blood pressure management, including lisinopril, HCTZ, spironolactone, and amlodipine.   The patient also takes Synthroid for thyroid management and metoprolol for palpitations. The patient reports that the palpitations have improved but are still present.   The patient also reports back pain on the right side below the shoulder blade, which occasionally causes discomfort but does not interfere with daily activities.   The patient has experienced significant personal loss, including the death of a son and husband.     Past Medical History:  Diagnosis Date   Abnormal blood chemistry 07/18/2013   Abnormal urine 01/04/2017   Anemia 05/09/2018   Chronic RLQ pain 01/04/2017   Costovertebral angle tenderness 01/04/2017   Diabetes mellitus (HCC)    History of gestational diabetes 05/09/2012   GDM 1 pregnancy on  insulin   History of Helicobacter pylori infection 06/25/2017   HTN (hypertension)    Hypercholesterolemia 07/18/2013   Hypokalemia 02/28/2014   Insomnia 07/18/2013   Mastodynia 06/06/2013   Nausea 06/04/2015   Newly recognized heart murmur 12/22/2013   OSA on CPAP    Other benign mammary dysplasias of right breast 01/14/2015   Right flank pain 01/04/2017   Sciatica    Screening for colon cancer 01/14/2015    Seasonal allergies 06/04/2015   Sensation of lump in throat 06/04/2015   Severe obesity (BMI 35.0-39.9) with comorbidity (HCC) 06/25/2017   Snoring 12/23/2016   Vaginal pain 01/14/2015   Vitamin D deficiency 07/23/2017   Past Surgical History:  Procedure Laterality Date   CESAREAN SECTION     x3   CHOLECYSTECTOMY     COLPOSCOPY     LUMBAR LAMINECTOMY     TONSILLECTOMY     Family Status  Relation Name Status   Mother  Deceased at age 25   Father  Deceased at age 42       CAR ACCIDENT   Sister  Alive   Sister  Armed forces training and education officer   Sister  Alive   Brother  Alive   Neg Hx  (Not Specified)  No partnership data on file   Family History  Problem Relation Age of Onset   Hypertension Mother    Stroke Mother    Thyroid disease Mother    Hypertension Sister    Hypertension Sister    Hypertension Sister    Colon cancer Neg Hx    Esophageal cancer Neg Hx    Rectal cancer Neg Hx    Stomach cancer Neg Hx    Social History   Socioeconomic History   Marital status: Single    Spouse name: Not on file   Number of children: 4   Years of education: Not on file   Highest education level: Not on file  Occupational History   Not on file  Tobacco Use   Smoking  status: Never   Smokeless tobacco: Never  Vaping Use   Vaping status: Never Used  Substance and Sexual Activity   Alcohol use: Yes    Comment: OCC   Drug use: No   Sexual activity: Never  Other Topics Concern   Not on file  Social History Narrative   Not on file   Social Drivers of Health   Financial Resource Strain: Low Risk  (07/01/2022)   Received from [facility], [facility]   Overall Financial Resource Strain (CARDIA)    Difficulty of Paying Living Expenses: Not hard at all  Food Insecurity: No Food Insecurity (07/01/2022)   Received from [facility], [facility]   Hunger Vital Sign    Worried About Running Out of Food in the Last Year: Never true    Ran Out of Food in the Last Year: Never true   Transportation Needs: No Transportation Needs (07/01/2022)   Received from [facility], [facility]   PRAPARE - Transportation    Lack of Transportation (Medical): No    Lack of Transportation (Non-Medical): No  Physical Activity: Insufficiently Active (09/24/2021)   Received from [facility], [facility]   Exercise Vital Sign    Days of Exercise per Week: 1 day    Minutes of Exercise per Session: 20 min  Stress: No Stress Concern Present (09/24/2021)   Received from [facility], [facility] of Occupational Health - Occupational Stress Questionnaire    Feeling of Stress : Not at all  Social Connections: Unknown (09/26/2022)   Received from [facility], [facility]   Social Network    Social Network: Not on file   Outpatient Medications Prior to Visit  Medication Sig   amLODipine (NORVASC) 5 MG tablet Take 1 tablet (5 mg total) by mouth daily.   Ascorbic Acid (VITAMIN C PO) Take 1 tablet by mouth daily.   aspirin 81 MG tablet Take 81 mg by mouth 2 (two) times a week.   atovaquone-proguanil (MALARONE) 250-100 MG TABS tablet Take 1 tablet by mouth daily.   B Complex Vitamins (VITAMIN-B COMPLEX PO) Take 1 tablet by mouth daily.   ciprofloxacin (CIPRO) 250 MG tablet Take 1 tablet (250 mg total) by mouth every 12 (twelve) hours as needed for 10 days (Patient taking differently: Take 250 mg by mouth every 12 (twelve) hours as needed (Infection).)   ferrous sulfate (FEROSUL) 325 (65 FE) MG tablet Take 1 tablet (325 mg total) by mouth every Monday, Wednesday, and Friday.   hydrochlorothiazide (HYDRODIURIL) 12.5 MG tablet Take 1 tablet (12.5 mg total) by mouth daily.   influenza vaccine adjuvanted (FLUAD) 0.5 ML injection Inject into the muscle. (Patient taking differently: Inject 0.5 mLs into the muscle once.)   levothyroxine (SYNTHROID) 50 MCG tablet Take 1 tablet (50 mcg total) by mouth daily.   lisinopril (ZESTRIL) 20 MG tablet Take 1 tablet (20 mg total) by  mouth daily.   mefloquine (LARIAM) 250 MG tablet Take 1 tablet (250 mg total) by mouth once a week.   metoprolol succinate (TOPROL-XL) 50 MG 24 hr tablet Take 1 tablet (50 mg total) by mouth daily. Take with or immediately following a meal.   metroNIDAZOLE (FLAGYL) 500 MG tablet Take 1 tablet (500 mg total) by mouth 3 (three) times daily as needed for 10 days (Patient taking differently: Take 500 mg by mouth 3 (three) times daily as needed (Yeast).)   omeprazole (PRILOSEC) 20 MG capsule Take 1 capsule (20 mg total) by  mouth daily.   Potassium Chloride ER 20 MEQ TBCR Take 1 tablet (20 mEq total) by mouth 3 (three) times daily.   spironolactone (ALDACTONE) 25 MG tablet Take 1 tablet (25 mg total) by mouth daily.   [DISCONTINUED] lisinopril-hydrochlorothiazide (ZESTORETIC) 20-12.5 MG tablet Take 1 tablet by mouth daily.   No facility-administered medications prior to visit.   Allergies  Allergen Reactions   Labetalol Itching    Scalp itching, hard to breathe, feel stuffy Scalp itching, hard to breathe, feel stuffy    Benazepril Cough    Immunization History  Administered Date(s) Administered   Fluad Quad(high Dose 65+) 04/02/2022   Fluad Trivalent(High Dose 65+) 03/03/2023   Fluzone Influenza virus vaccine,trivalent (IIV3), split virus 03/09/2015   Influenza-Unspecified 03/08/2017, 03/09/2018, 03/15/2019   PFIZER(Purple Top)SARS-COV-2 Vaccination 05/30/2019, 06/21/2019, 04/27/2020   PNEUMOCOCCAL CONJUGATE-20 04/15/2023   Pfizer Covid-19 Vaccine Bivalent Booster 76yrs & up 04/18/2021   Pfizer(Comirnaty)Fall Seasonal Vaccine 12 years and older 05/15/2022   Tdap 07/10/2016   Zoster Recombinant(Shingrix) 04/18/2021, 08/19/2021    Health Maintenance  Topic Date Due   Hepatitis C Screening  Never done   DEXA SCAN  Never done   COVID-19 Vaccine (6 - 2024-25 season) 02/07/2023   MAMMOGRAM  04/22/2025   DTaP/Tdap/Td (2 - Td or Tdap) 07/10/2026   Colonoscopy  02/16/2030   Pneumonia  Vaccine 76+ Years old  Completed   INFLUENZA VACCINE  Completed   Zoster Vaccines- Shingrix  Completed   HPV VACCINES  Aged Out    Patient Care Team: Alfredia Ferguson, PA-C as PCP - General (Physician Assistant)  Review of Systems  Constitutional:  Negative for fatigue and fever.  Respiratory:  Negative for cough and shortness of breath.   Cardiovascular:  Negative for chest pain and leg swelling.  Gastrointestinal:  Negative for abdominal pain.  Musculoskeletal:  Positive for back pain.  Neurological:  Negative for dizziness and headaches.        Objective    BP 137/81   Pulse 61   Temp 98.5 F (36.9 C) (Oral)   Ht 5\' 5"  (1.651 m)   Wt 212 lb 2 oz (96.2 kg)   SpO2 96%   BMI 35.30 kg/m     Physical Exam Constitutional:      General: She is awake.     Appearance: She is well-developed.  HENT:     Head: Normocephalic.  Eyes:     Conjunctiva/sclera: Conjunctivae normal.  Cardiovascular:     Rate and Rhythm: Normal rate and regular rhythm.     Heart sounds: Normal heart sounds.  Pulmonary:     Effort: Pulmonary effort is normal.     Breath sounds: Normal breath sounds.  Musculoskeletal:     Comments: Back pain is located thoracic, right below R shoulder blade  Skin:    General: Skin is warm.  Neurological:     Mental Status: She is alert and oriented to person, place, and time.  Psychiatric:        Attention and Perception: Attention normal.        Mood and Affect: Mood normal.        Speech: Speech normal.        Behavior: Behavior is cooperative.     Depression Screen    08/04/2023    2:32 PM  PHQ 2/9 Scores  PHQ - 2 Score 2  PHQ- 9 Score 2   No results found for any visits on 08/04/23.  Assessment & Plan  Primary hypertension Assessment & Plan: Chronic, well controlled. Managed with amlodipine 5 mg, lisinopril 20 mg, hctz 12.5 mg and spironolactone 25 mg.  Ordered cmp F/u 51mo  Orders: -     Comprehensive metabolic panel  History of  iron deficiency anemia Assessment & Plan: Repeat iron panel, cbc.  Pt reports she has had a GI w/u that was negative. Reports her pcp's next step was heme referral if still deficient.   Orders: -     CBC with Differential/Platelet -     IBC + Ferritin  Hypothyroidism, unspecified type Assessment & Plan: Managed on levothyroxine 50 mcg.  Repeat tsh/t4  Orders: -     TSH -     T4, free  Chronic right-sided thoracic back pain Assessment & Plan: Likely musculoskeletal. Recommended some exercises/stretches during pain flares   Lipid screening -     Lipid panel     Return in about 6 months (around 02/01/2024), or if symptoms worsen or fail to improve, for CPE.      Alfredia Ferguson, PA-C  [facility] at [facility] [phone] (phone) [phone] (fax)  [facility]

## 2023-08-05 ENCOUNTER — Encounter: Payer: Self-pay | Admitting: Physician Assistant

## 2023-08-05 LAB — LIPID PANEL
Cholesterol: 187 mg/dL (ref 0–200)
HDL: 51.2 mg/dL (ref >39.00)
LDL Cholesterol: 110 mg/dL — ABNORMAL HIGH (ref 0–99)
NonHDL: 135.77
Total CHOL/HDL Ratio: 4
Triglycerides: 127 mg/dL (ref 0.0–149.0)
VLDL: 25.4 mg/dL (ref 0.0–40.0)

## 2023-08-05 LAB — CBC WITH DIFFERENTIAL/PLATELET
Basophils Absolute: 0 10*3/uL (ref 0.0–0.1)
Basophils Relative: 0.7 % (ref 0.0–3.0)
Eosinophils Absolute: 0.1 10*3/uL (ref 0.0–0.7)
Eosinophils Relative: 2.8 % (ref 0.0–5.0)
HCT: 36.8 % (ref 36.0–46.0)
Hemoglobin: 12 g/dL (ref 12.0–15.0)
Lymphocytes Relative: 35.9 % (ref 12.0–46.0)
Lymphs Abs: 1.9 10*3/uL (ref 0.7–4.0)
MCHC: 32.6 g/dL (ref 30.0–36.0)
MCV: 98.8 fL (ref 78.0–100.0)
Monocytes Absolute: 0.4 10*3/uL (ref 0.1–1.0)
Monocytes Relative: 7 % (ref 3.0–12.0)
Neutro Abs: 2.9 10*3/uL (ref 1.4–7.7)
Neutrophils Relative %: 53.6 % (ref 43.0–77.0)
Platelets: 193 10*3/uL (ref 150.0–400.0)
RBC: 3.73 Mil/uL — ABNORMAL LOW (ref 3.87–5.11)
RDW: 13.8 % (ref 11.5–15.5)
WBC: 5.3 10*3/uL (ref 4.0–10.5)

## 2023-08-05 LAB — COMPREHENSIVE METABOLIC PANEL
ALT: 12 U/L (ref 0–35)
AST: 16 U/L (ref 0–37)
Albumin: 4.2 g/dL (ref 3.5–5.2)
Alkaline Phosphatase: 52 U/L (ref 39–117)
BUN: 14 mg/dL (ref 6–23)
CO2: 29 meq/L (ref 19–32)
Calcium: 9.3 mg/dL (ref 8.4–10.5)
Chloride: 103 meq/L (ref 96–112)
Creatinine, Ser: 0.98 mg/dL (ref 0.40–1.20)
GFR: 60.1 mL/min (ref >60.00)
Glucose, Bld: 84 mg/dL (ref 70–99)
Potassium: 4.1 meq/L (ref 3.5–5.1)
Sodium: 141 meq/L (ref 135–145)
Total Bilirubin: 0.8 mg/dL (ref 0.2–1.2)
Total Protein: 7.5 g/dL (ref 6.0–8.3)

## 2023-08-05 LAB — IBC + FERRITIN
Ferritin: 29.9 ng/mL (ref 10.0–291.0)
Iron: 91 ug/dL (ref 42–145)
Saturation Ratios: 21.8 % (ref 20.0–50.0)
TIBC: 417.2 ug/dL (ref 250.0–450.0)
Transferrin: 298 mg/dL (ref 212.0–360.0)

## 2023-08-05 LAB — TSH: TSH: 2.53 u[IU]/mL (ref 0.35–5.50)

## 2023-08-05 LAB — T4, FREE: Free T4: 1.04 ng/dL (ref 0.60–1.60)

## 2023-08-09 DIAGNOSIS — G4733 Obstructive sleep apnea (adult) (pediatric): Secondary | ICD-10-CM | POA: Diagnosis not present

## 2023-08-19 ENCOUNTER — Other Ambulatory Visit (HOSPITAL_BASED_OUTPATIENT_CLINIC_OR_DEPARTMENT_OTHER): Payer: Self-pay

## 2023-08-19 ENCOUNTER — Other Ambulatory Visit: Payer: Self-pay | Admitting: Physician Assistant

## 2023-08-20 NOTE — Telephone Encounter (Signed)
Called patient twice but no answer.

## 2023-08-26 ENCOUNTER — Other Ambulatory Visit (HOSPITAL_BASED_OUTPATIENT_CLINIC_OR_DEPARTMENT_OTHER): Payer: Self-pay

## 2023-08-27 ENCOUNTER — Other Ambulatory Visit (HOSPITAL_BASED_OUTPATIENT_CLINIC_OR_DEPARTMENT_OTHER): Payer: Self-pay

## 2023-08-27 ENCOUNTER — Other Ambulatory Visit: Payer: Self-pay

## 2023-08-27 ENCOUNTER — Other Ambulatory Visit: Payer: Self-pay | Admitting: *Deleted

## 2023-08-27 MED ORDER — SPIRONOLACTONE 25 MG PO TABS
25.0000 mg | ORAL_TABLET | Freq: Every day | ORAL | 1 refills | Status: DC
  Filled 2023-08-27: qty 90, 90d supply, fill #0

## 2023-08-27 MED ORDER — MEFLOQUINE HCL 250 MG PO TABS
250.0000 mg | ORAL_TABLET | ORAL | 0 refills | Status: DC
  Filled 2023-08-27: qty 4, 28d supply, fill #0

## 2023-08-27 NOTE — Telephone Encounter (Signed)
 Copied from CRM [id number]. Topic: Clinical - Medication Question >> Aug 27, 2023 10:02 AM Drema Balzarine wrote: Reason for CRM: Patient returning Ambers phone call regarding medication clarification

## 2023-08-27 NOTE — Telephone Encounter (Signed)
 LMOM for pt to return call for medication clarification.

## 2023-08-30 ENCOUNTER — Other Ambulatory Visit (HOSPITAL_BASED_OUTPATIENT_CLINIC_OR_DEPARTMENT_OTHER): Payer: Self-pay

## 2023-08-30 ENCOUNTER — Other Ambulatory Visit: Payer: Self-pay

## 2023-08-30 DIAGNOSIS — G4733 Obstructive sleep apnea (adult) (pediatric): Secondary | ICD-10-CM | POA: Diagnosis not present

## 2023-09-17 ENCOUNTER — Other Ambulatory Visit (HOSPITAL_BASED_OUTPATIENT_CLINIC_OR_DEPARTMENT_OTHER): Payer: Self-pay

## 2023-09-30 ENCOUNTER — Ambulatory Visit: Admitting: Adult Health

## 2023-09-30 ENCOUNTER — Encounter: Payer: Self-pay | Admitting: Adult Health

## 2023-09-30 VITALS — BP 133/68 | HR 54 | Ht 65.0 in | Wt 218.4 lb

## 2023-09-30 DIAGNOSIS — Z6835 Body mass index (BMI) 35.0-35.9, adult: Secondary | ICD-10-CM

## 2023-09-30 DIAGNOSIS — G4733 Obstructive sleep apnea (adult) (pediatric): Secondary | ICD-10-CM

## 2023-09-30 NOTE — Assessment & Plan Note (Signed)
 Healthy weight loss discussed

## 2023-09-30 NOTE — Patient Instructions (Signed)
 Continue on CPAP At bedtime, wear all night  Keep up good work. Work on healthy weight  Activity as tolerated.  Follow up in 1 year and As needed

## 2023-09-30 NOTE — Assessment & Plan Note (Signed)
 Excellent control and compliance on nocturnal CPAP.  Patient has perceived benefit.  Continue on current settings.  Patient education given on sleep apnea and CPAP care  - discussed how weight can impact sleep and risk for sleep disordered breathing - discussed options to assist with weight loss: combination of diet modification, cardiovascular and strength training exercises   - had an extensive discussion regarding the adverse health consequences related to untreated sleep disordered breathing - specifically discussed the risks for hypertension, coronary artery disease, cardiac dysrhythmias, cerebrovascular disease, and diabetes - lifestyle modification discussed   - discussed how sleep disruption can increase risk of accidents, particularly when driving - safe driving practices were discussed   Plan  Patient Instructions  Continue on CPAP At bedtime, wear all night  Keep up good work. Work on healthy weight  Activity as tolerated.  Follow up in 1 year and As needed

## 2023-09-30 NOTE — Progress Notes (Signed)
 @Patient  ID: Janet Phelps, female    DOB: 21-Mar-1956, 68 y.o.   MRN: [id number]  Chief Complaint  Patient presents with   Consult    Referring provider: Trenton Frock, Kirby Peoples  HPI: 68 yo female seen for sleep consult 09/30/23 to establish for sleep apnea.  NP [facility] (Psych)   TEST/EVENTS :  NPSG 10/09/15 showed mild sleep apnea with AHI 12.5/hour SpO2 low at 80%. CPAP 09/24/22 Titration successful at 12cmH2o.   09/30/2023 Sleep consult Patient presents for sleep consult today to establish for sleep apnea.  Patient has been on CPAP since 2017.  In-lab sleep study Oct 09, 2015 showed mild sleep apnea AHI 12.5/hour and SpO2 low at 80%.  Patient says she was started on CPAP and has been wearing everything.  She says she does well on CPAP.  She feels that she benefits from CPAP with decreased daytime sleepiness.  She currently uses a fullface mask. Typically goes to bed about 11 PM.  Takes up to 20 minutes to go to sleep.  Gets up at 7 AM. DME is [employer].  Patient says she occasionally takes a nap.  Does not use any sleep aids.  She does not have any history of congestive heart failure or stroke.  Has no caffeine intake.  Epworth score is 3 out of 24.  She has no symptoms of visit for cataplexy or sleep paralysis.  CPAP download shows excellent compliance with daily average usage at 7 hours.  Patient is on CPAP 12 cm H2O.  AHI 2.3/hour.  Social history patient is widowed.  Has 4 children.  Patient tragically lost her husband and son 2024.  She is a never smoker.  No alcohol or drug use. 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  She is from [place].  Surgical history Past Surgical History:  Procedure Laterality Date   CESAREAN SECTION     x3   CHOLECYSTECTOMY     COLPOSCOPY     LUMBAR LAMINECTOMY     TONSILLECTOMY        Allergies  Allergen Reactions   Labetalol Itching    Scalp itching, hard to breathe, feel stuffy Scalp  itching, hard to breathe, feel stuffy    Benazepril  Cough    Immunization History  Administered Date(s) Administered   Fluad Quad(high Dose 65+) 04/02/2022   Fluad Trivalent(High Dose 65+) 03/03/2023   Fluzone Influenza virus vaccine,trivalent (IIV3), split virus 03/09/2015   Influenza-Unspecified 03/08/2017, 03/09/2018, 03/15/2019   PFIZER(Purple Top)SARS-COV-2 Vaccination 05/30/2019, 06/21/2019, 04/27/2020   PNEUMOCOCCAL CONJUGATE-20 04/15/2023   Pfizer Covid-19 Vaccine Bivalent Booster 58yrs & up 04/18/2021   Pfizer(Comirnaty )Fall Seasonal Vaccine 12 years and older 05/15/2022   Tdap 07/10/2016   Zoster Recombinant(Shingrix) 04/18/2021, 08/19/2021    Past Medical History:  Diagnosis Date   Abnormal blood chemistry 07/18/2013   Abnormal urine 01/04/2017   Anemia 05/09/2018   Chronic RLQ pain 01/04/2017   Costovertebral angle tenderness 01/04/2017   Diabetes mellitus (HCC)    History of gestational diabetes 05/09/2012   GDM 1 pregnancy on  insulin   History of Helicobacter pylori infection 06/25/2017   HTN (hypertension)    Hypercholesterolemia 07/18/2013   Hypokalemia 02/28/2014   Insomnia 07/18/2013   Mastodynia 06/06/2013   Nausea 06/04/2015   Newly recognized heart murmur 12/22/2013   OSA on CPAP    Other benign mammary dysplasias of right breast 01/14/2015   Right flank pain 01/04/2017   Sciatica    Screening  for colon cancer 01/14/2015   Seasonal allergies 06/04/2015   Sensation of lump in throat 06/04/2015   Severe obesity (BMI 35.0-39.9) with comorbidity (HCC) 06/25/2017   Snoring 12/23/2016   Vaginal pain 01/14/2015   Vitamin D deficiency 07/23/2017    Tobacco History: Social History   Tobacco Use  Smoking Status Never   Passive exposure: Never  Smokeless Tobacco Never   Counseling given: Not Answered   Outpatient Medications Prior to Visit  Medication Sig Dispense Refill   amLODipine  (NORVASC ) 5 MG tablet Take 1 tablet (5 mg total) by mouth  daily. 90 tablet 1   Ascorbic Acid (VITAMIN C PO) Take 1 tablet by mouth daily.     aspirin 81 MG tablet Take 81 mg by mouth 2 (two) times a week.     atovaquone -proguanil (MALARONE ) 250-100 MG TABS tablet Take 1 tablet by mouth daily. 10 tablet 0   B Complex Vitamins (VITAMIN-B COMPLEX PO) Take 1 tablet by mouth daily.     ciprofloxacin  (CIPRO ) 250 MG tablet Take 1 tablet (250 mg total) by mouth every 12 (twelve) hours as needed for 10 days (Patient taking differently: Take 250 mg by mouth every 12 (twelve) hours as needed (Infection).) 20 tablet 0   ferrous sulfate  (FEROSUL) 325 (65 FE) MG tablet Take 1 tablet (325 mg total) by mouth every Monday, Wednesday, and Friday. 100 tablet 0   hydrochlorothiazide  (HYDRODIURIL ) 12.5 MG tablet Take 1 tablet (12.5 mg total) by mouth daily. 90 tablet 1   levothyroxine  (SYNTHROID ) 50 MCG tablet Take 1 tablet (50 mcg total) by mouth daily. 90 tablet 1   lisinopril  (ZESTRIL ) 20 MG tablet Take 1 tablet (20 mg total) by mouth daily. 90 tablet 1   mefloquine  (LARIAM ) 250 MG tablet Take 1 tablet (250 mg total) by mouth once a week. 4 tablet 0   mefloquine  (LARIAM ) 250 MG tablet Take 1 tablet (250 mg total) by mouth once a week. 4 tablet 0   metoprolol  succinate (TOPROL -XL) 50 MG 24 hr tablet Take 1 tablet (50 mg total) by mouth daily. Take with or immediately following a meal. 90 tablet 2   metroNIDAZOLE  (FLAGYL ) 500 MG tablet Take 1 tablet (500 mg total) by mouth 3 (three) times daily as needed for 10 days (Patient taking differently: Take 500 mg by mouth 3 (three) times daily as needed (Yeast).) 30 tablet 0   omeprazole  (PRILOSEC) 20 MG capsule Take 1 capsule (20 mg total) by mouth daily. 30 capsule 3   Potassium Chloride  ER 20 MEQ TBCR Take 1 tablet (20 mEq total) by mouth 3 (three) times daily. 270 tablet 3   spironolactone  (ALDACTONE ) 25 MG tablet Take 1 tablet (25 mg total) by mouth daily. 90 tablet 1   No facility-administered medications prior to visit.      Review of Systems:   Constitutional:   No  weight loss, night sweats,  Fevers, chills, +fatigue, or  lassitude.  HEENT:   No headaches,  Difficulty swallowing,  Tooth/dental problems, or  Sore throat,                No sneezing, itching, ear ache, nasal congestion, post nasal drip,   CV:  No chest pain,  Orthopnea, PND, swelling in lower extremities, anasarca, dizziness, palpitations, syncope.   GI  No heartburn, indigestion, abdominal pain, nausea, vomiting, diarrhea, change in bowel habits, loss of appetite, bloody stools.   Resp: No shortness of breath with exertion or at rest.  No excess mucus, no  productive cough,  No non-productive cough,  No coughing up of blood.  No change in color of mucus.  No wheezing.  No chest wall deformity  Skin: no rash or lesions.  GU: no dysuria, change in color of urine, no urgency or frequency.  No flank pain, no hematuria   MS:  No joint pain or swelling.  No decreased range of motion.  No back pain.    Physical Exam  BP 133/68 (BP Location: Left Arm, Patient Position: Sitting, Cuff Size: Large)   Pulse (!) 54   Ht 5\' 5"  (1.651 m)   Wt 218 lb 6.4 oz (99.1 kg)   SpO2 98%   BMI 36.34 kg/m   GEN: A/Ox3; pleasant , NAD, well nourished    HEENT:  [place]/AT,  NOSE-clear, THROAT-clear, no lesions, no postnasal drip or exudate noted.  Class II-III MP airway  NECK:  Supple w/ fair ROM; no JVD; normal carotid impulses w/o bruits; no thyromegaly or nodules palpated; no lymphadenopathy.    RESP  Clear  P & A; w/o, wheezes/ rales/ or rhonchi. no accessory muscle use, no dullness to percussion  CARD:  RRR, no m/r/g, no peripheral edema, pulses intact, no cyanosis or clubbing.  GI:   Soft & nt; nml bowel sounds; no organomegaly or masses detected.   Musco: Warm bil, no deformities or joint swelling noted.   Neuro: alert, no focal deficits noted.    Skin: Warm, no lesions or rashes    Lab Results:  CBC    Component Value Date/Time    WBC 5.3 08/04/2023 1417   RBC 3.73 (L) 08/04/2023 1417   HGB 12.0 08/04/2023 1417   HCT 36.8 08/04/2023 1417   PLT 193.0 08/04/2023 1417   MCV 98.8 08/04/2023 1417   MCH 31.6 10/17/2022 2048   MCHC 32.6 08/04/2023 1417   RDW 13.8 08/04/2023 1417   LYMPHSABS 1.9 08/04/2023 1417   MONOABS 0.4 08/04/2023 1417   EOSABS 0.1 08/04/2023 1417   BASOSABS 0.0 08/04/2023 1417    BMET    Component Value Date/Time   NA 141 08/04/2023 1417   NA 141 03/03/2023 1055   K 4.1 08/04/2023 1417   CL 103 08/04/2023 1417   CO2 29 08/04/2023 1417   GLUCOSE 84 08/04/2023 1417   BUN 14 08/04/2023 1417   BUN 20 03/03/2023 1055   CREATININE 0.98 08/04/2023 1417   CALCIUM 9.3 08/04/2023 1417   GFRNONAA >60 10/17/2022 2048   GFRAA >60 08/03/2015 0847    BNP    Component Value Date/Time   BNP 52.9 10/17/2022 2049    ProBNP No results found for: "PROBNP"  Imaging: No results found.  Administration History     None           No data to display          No results found for: "NITRICOXIDE"      Assessment & Plan:   OSA on CPAP Excellent control and compliance on nocturnal CPAP.  Patient has perceived benefit.  Continue on current settings.  Patient education given on sleep apnea and CPAP care  - discussed how weight can impact sleep and risk for sleep disordered breathing - discussed options to assist with weight loss: combination of diet modification, cardiovascular and strength training exercises   - had an extensive discussion regarding the adverse health consequences related to untreated sleep disordered breathing - specifically discussed the risks for hypertension, coronary artery disease, cardiac dysrhythmias, cerebrovascular disease, and diabetes - lifestyle  modification discussed   - discussed how sleep disruption can increase risk of accidents, particularly when driving - safe driving practices were discussed   Plan  Patient Instructions  Continue on CPAP At  bedtime, wear all night  Keep up good work. Work on healthy weight  Activity as tolerated.  Follow up in 1 year and As needed      Severe obesity (BMI 35.0-39.9) with comorbidity (HCC) Healthy weight loss discussed    Roena Clark, NP 09/30/2023

## 2023-10-04 DIAGNOSIS — G4733 Obstructive sleep apnea (adult) (pediatric): Secondary | ICD-10-CM | POA: Diagnosis not present

## 2023-10-13 ENCOUNTER — Other Ambulatory Visit (HOSPITAL_BASED_OUTPATIENT_CLINIC_OR_DEPARTMENT_OTHER): Payer: Self-pay

## 2023-10-13 ENCOUNTER — Other Ambulatory Visit: Payer: Self-pay | Admitting: Gastroenterology

## 2023-10-13 DIAGNOSIS — K319 Disease of stomach and duodenum, unspecified: Secondary | ICD-10-CM

## 2023-10-13 DIAGNOSIS — K219 Gastro-esophageal reflux disease without esophagitis: Secondary | ICD-10-CM

## 2023-10-13 DIAGNOSIS — R1011 Right upper quadrant pain: Secondary | ICD-10-CM

## 2023-10-13 MED ORDER — OMEPRAZOLE 20 MG PO CPDR
20.0000 mg | DELAYED_RELEASE_CAPSULE | Freq: Every day | ORAL | 3 refills | Status: DC
  Filled 2023-10-13 – 2023-11-30 (×4): qty 30, 30d supply, fill #0

## 2023-10-15 ENCOUNTER — Encounter (HOSPITAL_COMMUNITY): Payer: Self-pay

## 2023-10-19 ENCOUNTER — Other Ambulatory Visit (HOSPITAL_BASED_OUTPATIENT_CLINIC_OR_DEPARTMENT_OTHER): Payer: Self-pay

## 2023-10-20 ENCOUNTER — Other Ambulatory Visit (HOSPITAL_BASED_OUTPATIENT_CLINIC_OR_DEPARTMENT_OTHER): Payer: Self-pay

## 2023-10-21 ENCOUNTER — Other Ambulatory Visit (HOSPITAL_BASED_OUTPATIENT_CLINIC_OR_DEPARTMENT_OTHER): Payer: Self-pay

## 2023-10-21 ENCOUNTER — Other Ambulatory Visit: Payer: Self-pay

## 2023-10-22 ENCOUNTER — Telehealth: Payer: Self-pay | Admitting: Physician Assistant

## 2023-10-22 ENCOUNTER — Other Ambulatory Visit: Payer: Self-pay | Admitting: Obstetrics and Gynecology

## 2023-10-22 ENCOUNTER — Other Ambulatory Visit: Payer: Self-pay | Admitting: *Deleted

## 2023-10-22 ENCOUNTER — Ambulatory Visit
Admission: RE | Admit: 2023-10-22 | Discharge: 2023-10-22 | Disposition: A | Discharge status: Home / Self Care | Source: Ambulatory Visit | Attending: Obstetrics and Gynecology | Admitting: Obstetrics and Gynecology

## 2023-10-22 ENCOUNTER — Other Ambulatory Visit (HOSPITAL_BASED_OUTPATIENT_CLINIC_OR_DEPARTMENT_OTHER): Payer: Self-pay

## 2023-10-22 DIAGNOSIS — R921 Mammographic calcification found on diagnostic imaging of breast: Secondary | ICD-10-CM

## 2023-10-22 IMAGING — DX DG LUMBAR SPINE COMPLETE 4+V
5 series · 5 of 5 positions shown · non-contrast
Comparison: None Available.

CLINICAL DATA: Muscle pain

EXAM:
LUMBAR SPINE - COMPLETE 4+ VIEW

[lumbar spine ap]
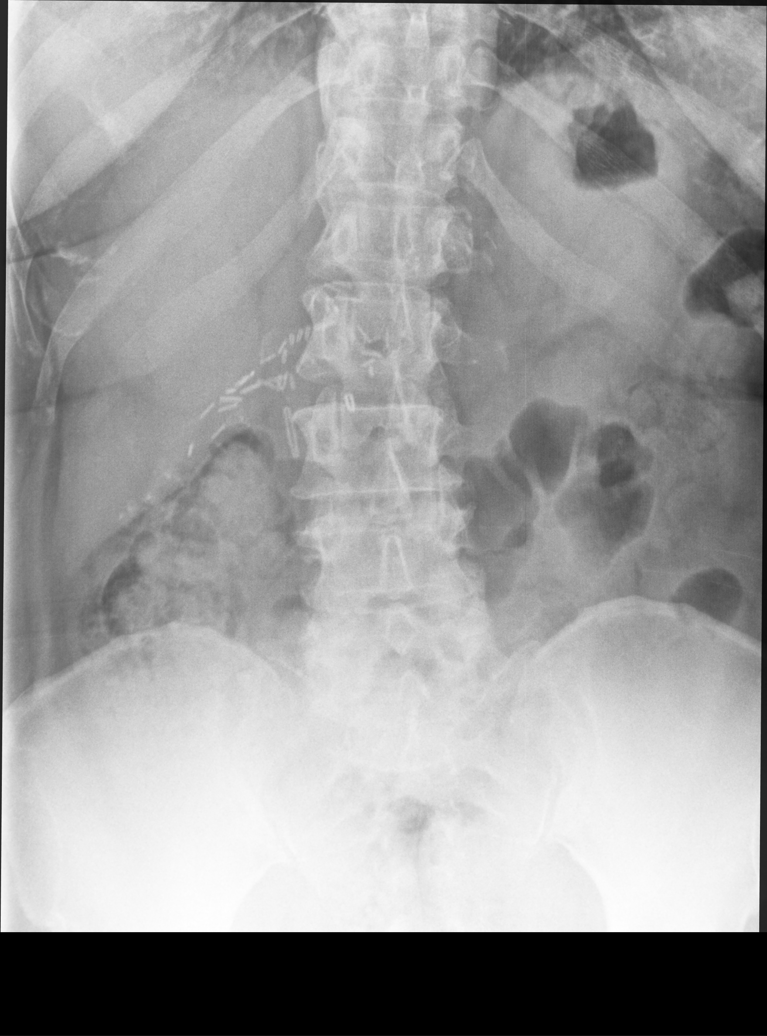

[lumbar spine lmo]
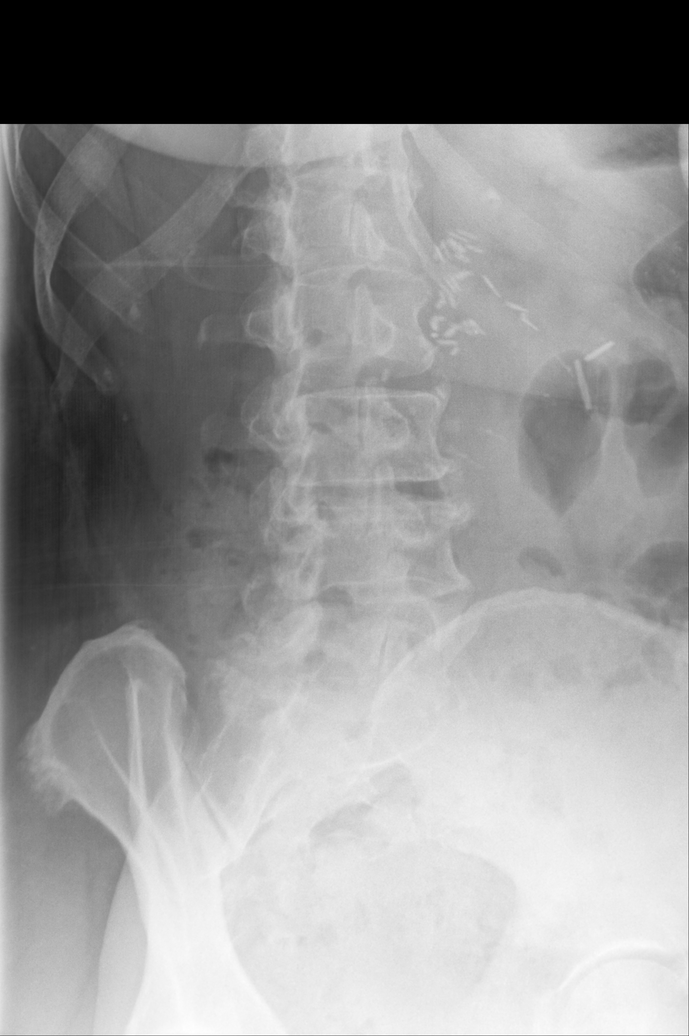

[lumbar spine mlo]
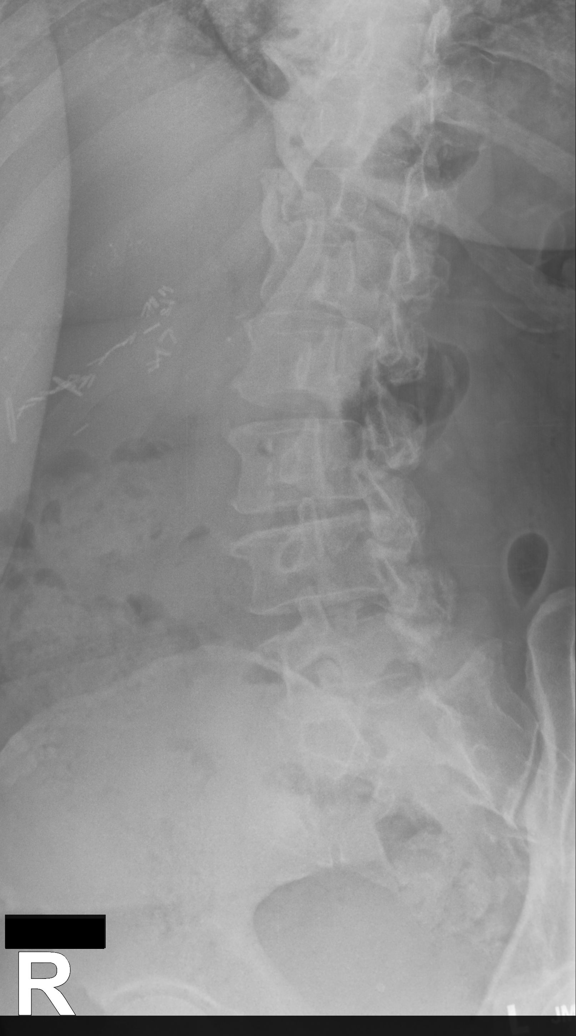

[lumbar spine lat (1 of 2)]
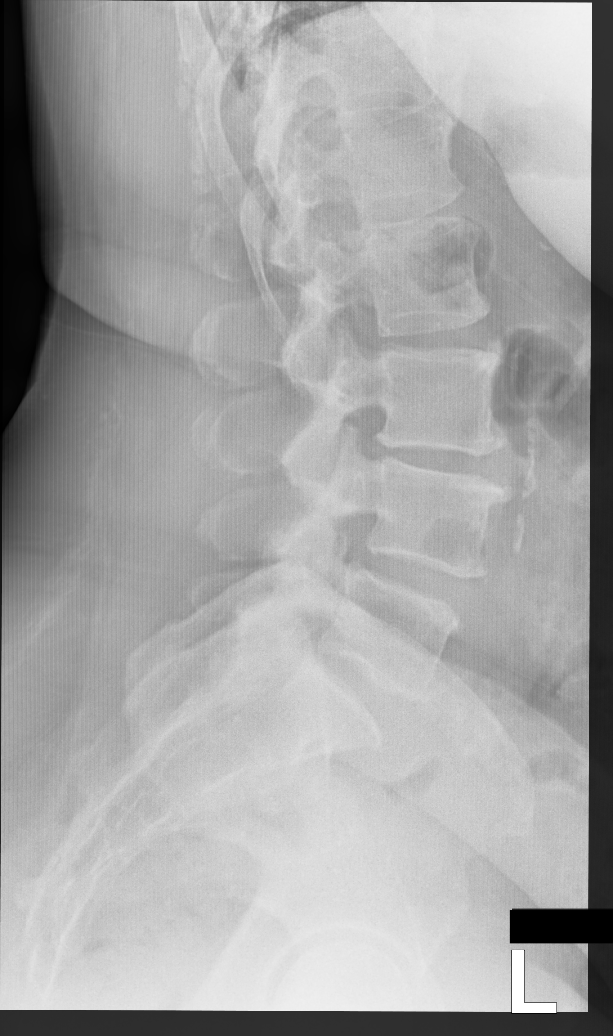

[lumbar spine lat (2 of 2)]
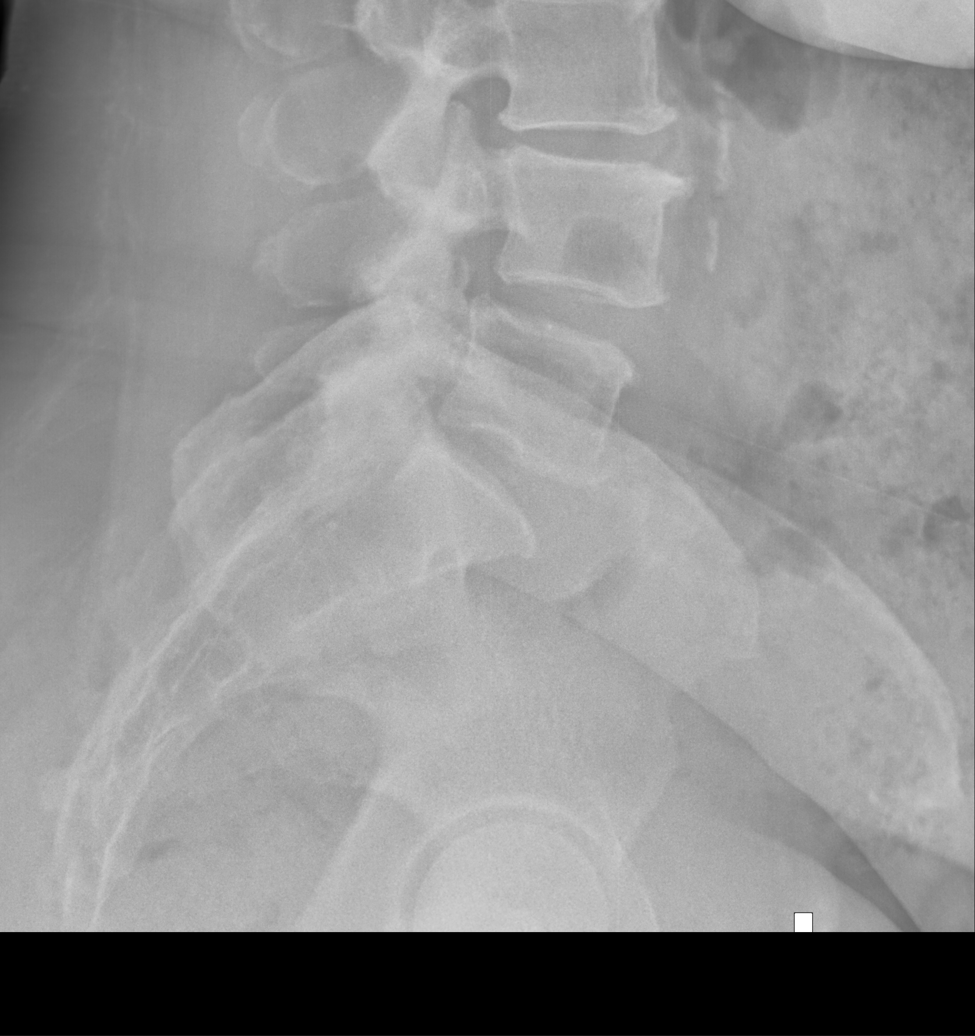

[5 of 5 positions shown; findings below may reference images not displayed]

FINDINGS: Normal alignment of lumbar vertebral bodies. No loss of vertebral
body height or disc height. No pars fracture. No subluxation.
Degenerative osteophytosis of the spine.

Multiple surgical clips in the RIGHT mid abdomen
IMPRESSION: No acute findings lumbar spine.  Mild disc osteophytic disease.

## 2023-10-22 MED ORDER — LEVOTHYROXINE SODIUM 50 MCG PO TABS
50.0000 ug | ORAL_TABLET | Freq: Every day | ORAL | 1 refills | Status: DC
  Filled 2023-10-22 (×2): qty 90, 90d supply, fill #0

## 2023-10-22 MED ORDER — LISINOPRIL 20 MG PO TABS
20.0000 mg | ORAL_TABLET | Freq: Every day | ORAL | 1 refills | Status: DC
  Filled 2023-10-22: qty 90, 90d supply, fill #0

## 2023-10-22 MED ORDER — AMLODIPINE BESYLATE 5 MG PO TABS
5.0000 mg | ORAL_TABLET | Freq: Every day | ORAL | 1 refills | Status: DC
  Filled 2023-10-22: qty 90, 90d supply, fill #0

## 2023-10-22 NOTE — Telephone Encounter (Signed)
 Copied from CRM [id number]. Topic: Clinical - Medication Refill >> Oct 22, 2023  9:32 AM Baldo Levan wrote: Medication: amLODipine  (NORVASC ) 5 MG tablet [[id number]   lisinopril  (ZESTRIL ) 20 MG tablet [[id number]]  levothyroxine  (SYNTHROID ) 50 MCG tablet [[id number]]  Has the patient contacted their pharmacy? Yes (Agent: If no, request that the patient contact the pharmacy for the refill. If patient does not wish to contact the pharmacy document the reason why and proceed with request.) (Agent: If yes, when and what did the pharmacy advise?)  This is the patient's preferred pharmacy:  [facility] [place] [facility] [address] [place] [address] Phone: [phone] Fax: [phone]  Is this the correct pharmacy for this prescription? Yes If no, delete pharmacy and type the correct one.   Has the prescription been filled recently? No  Is the patient out of the medication? Yes  Has the patient been seen for an appointment in the last year OR does the patient have an upcoming appointment? Yes  Can we respond through MyChart? Yes  Agent: Please be advised that Rx refills may take up to 3 business days. We ask that you follow-up with your pharmacy.

## 2023-10-22 NOTE — Telephone Encounter (Signed)
 Refills sent to pharmacy.

## 2023-10-25 ENCOUNTER — Other Ambulatory Visit (HOSPITAL_BASED_OUTPATIENT_CLINIC_OR_DEPARTMENT_OTHER): Payer: Self-pay

## 2023-10-29 ENCOUNTER — Other Ambulatory Visit (HOSPITAL_BASED_OUTPATIENT_CLINIC_OR_DEPARTMENT_OTHER): Payer: Self-pay

## 2023-11-02 ENCOUNTER — Other Ambulatory Visit: Payer: Self-pay

## 2023-11-05 ENCOUNTER — Other Ambulatory Visit (HOSPITAL_BASED_OUTPATIENT_CLINIC_OR_DEPARTMENT_OTHER): Payer: Self-pay

## 2023-11-05 ENCOUNTER — Other Ambulatory Visit: Payer: Self-pay | Admitting: Physician Assistant

## 2023-11-05 MED ORDER — HYDROCHLOROTHIAZIDE 12.5 MG PO TABS
12.5000 mg | ORAL_TABLET | Freq: Every day | ORAL | 1 refills | Status: DC
Start: 2023-11-05 — End: 2024-01-13
  Filled 2023-11-05: qty 90, 90d supply, fill #0

## 2023-11-09 ENCOUNTER — Other Ambulatory Visit (HOSPITAL_BASED_OUTPATIENT_CLINIC_OR_DEPARTMENT_OTHER): Payer: Self-pay

## 2023-11-23 ENCOUNTER — Other Ambulatory Visit (HOSPITAL_BASED_OUTPATIENT_CLINIC_OR_DEPARTMENT_OTHER): Payer: Self-pay

## 2023-11-24 ENCOUNTER — Other Ambulatory Visit (HOSPITAL_BASED_OUTPATIENT_CLINIC_OR_DEPARTMENT_OTHER): Payer: Self-pay

## 2023-11-30 ENCOUNTER — Other Ambulatory Visit (HOSPITAL_BASED_OUTPATIENT_CLINIC_OR_DEPARTMENT_OTHER): Payer: Self-pay

## 2023-12-06 ENCOUNTER — Other Ambulatory Visit (HOSPITAL_BASED_OUTPATIENT_CLINIC_OR_DEPARTMENT_OTHER): Payer: Self-pay

## 2024-01-03 ENCOUNTER — Other Ambulatory Visit: Payer: Self-pay | Admitting: Cardiology

## 2024-01-04 ENCOUNTER — Other Ambulatory Visit (HOSPITAL_BASED_OUTPATIENT_CLINIC_OR_DEPARTMENT_OTHER): Payer: Self-pay

## 2024-01-04 MED ORDER — POTASSIUM CHLORIDE ER 20 MEQ PO TBCR
20.0000 meq | EXTENDED_RELEASE_TABLET | Freq: Three times a day (TID) | ORAL | 1 refills | Status: AC
  Filled 2024-05-16: qty 270, 90d supply, fill #0

## 2024-01-13 ENCOUNTER — Ambulatory Visit (INDEPENDENT_AMBULATORY_CARE_PROVIDER_SITE_OTHER): Payer: Self-pay | Admitting: Physician Assistant

## 2024-01-13 ENCOUNTER — Encounter: Payer: Self-pay | Admitting: Physician Assistant

## 2024-01-13 ENCOUNTER — Telehealth: Payer: Self-pay | Admitting: Gastroenterology

## 2024-01-13 VITALS — BP 114/72 | HR 71 | Ht 65.0 in | Wt 215.2 lb

## 2024-01-13 DIAGNOSIS — E039 Hypothyroidism, unspecified: Secondary | ICD-10-CM | POA: Diagnosis not present

## 2024-01-13 DIAGNOSIS — K319 Disease of stomach and duodenum, unspecified: Secondary | ICD-10-CM

## 2024-01-13 DIAGNOSIS — R1011 Right upper quadrant pain: Secondary | ICD-10-CM

## 2024-01-13 DIAGNOSIS — K219 Gastro-esophageal reflux disease without esophagitis: Secondary | ICD-10-CM

## 2024-01-13 DIAGNOSIS — I10 Essential (primary) hypertension: Secondary | ICD-10-CM

## 2024-01-13 DIAGNOSIS — R600 Localized edema: Secondary | ICD-10-CM | POA: Diagnosis not present

## 2024-01-13 LAB — CBC WITH DIFFERENTIAL/PLATELET
Basophils Absolute: 0 K/uL (ref 0.0–0.1)
Basophils Relative: 0.5 % (ref 0.0–3.0)
Eosinophils Absolute: 0.1 K/uL (ref 0.0–0.7)
Eosinophils Relative: 2.5 % (ref 0.0–5.0)
HCT: 37.6 % (ref 36.0–46.0)
Hemoglobin: 12.5 g/dL (ref 12.0–15.0)
Lymphocytes Relative: 38.1 % (ref 12.0–46.0)
Lymphs Abs: 1.8 K/uL (ref 0.7–4.0)
MCHC: 33.2 g/dL (ref 30.0–36.0)
MCV: 96.6 fl (ref 78.0–100.0)
Monocytes Absolute: 0.4 K/uL (ref 0.1–1.0)
Monocytes Relative: 8.1 % (ref 3.0–12.0)
Neutro Abs: 2.4 K/uL (ref 1.4–7.7)
Neutrophils Relative %: 50.8 % (ref 43.0–77.0)
Platelets: 182 K/uL (ref 150.0–400.0)
RBC: 3.89 Mil/uL (ref 3.87–5.11)
RDW: 14.2 % (ref 11.5–15.5)
WBC: 4.7 K/uL (ref 4.0–10.5)

## 2024-01-13 LAB — BRAIN NATRIURETIC PEPTIDE: Pro B Natriuretic peptide (BNP): 16 pg/mL (ref 0.0–100.0)

## 2024-01-13 LAB — BASIC METABOLIC PANEL WITH GFR
BUN: 15 mg/dL (ref 6–23)
CO2: 27 meq/L (ref 19–32)
Calcium: 9.1 mg/dL (ref 8.4–10.5)
Chloride: 103 meq/L (ref 96–112)
Creatinine, Ser: 1.02 mg/dL (ref 0.40–1.20)
GFR: 57.1 mL/min — ABNORMAL LOW (ref >60.00)
Glucose, Bld: 116 mg/dL — ABNORMAL HIGH (ref 70–99)
Potassium: 4.4 meq/L (ref 3.5–5.1)
Sodium: 141 meq/L (ref 135–145)

## 2024-01-13 LAB — MAGNESIUM: Magnesium: 1.8 mg/dL (ref 1.5–2.5)

## 2024-01-13 LAB — TSH: TSH: 3.6 u[IU]/mL (ref 0.35–5.50)

## 2024-01-13 MED ORDER — OMEPRAZOLE 20 MG PO CPDR: 20.0000 mg | DELAYED_RELEASE_CAPSULE | Freq: Every day | ORAL | 2 refills | Status: DC

## 2024-01-13 MED ORDER — AMLODIPINE BESYLATE 5 MG PO TABS: 5.0000 mg | ORAL_TABLET | Freq: Every day | ORAL | 1 refills | Status: AC

## 2024-01-13 MED ORDER — METOPROLOL SUCCINATE ER 50 MG PO TB24: 50.0000 mg | ORAL_TABLET | Freq: Every day | ORAL | 2 refills | Status: AC

## 2024-01-13 MED ORDER — LISINOPRIL 20 MG PO TABS: 20.0000 mg | ORAL_TABLET | Freq: Every day | ORAL | 1 refills | Status: DC

## 2024-01-13 MED ORDER — LEVOTHYROXINE SODIUM 50 MCG PO TABS: 50.0000 ug | ORAL_TABLET | Freq: Every day | ORAL | 1 refills | Status: AC

## 2024-01-13 MED ORDER — HYDROCHLOROTHIAZIDE 12.5 MG PO TABS: 12.5000 mg | ORAL_TABLET | Freq: Every day | ORAL | 1 refills | Status: AC

## 2024-01-13 NOTE — Telephone Encounter (Signed)
 Called and spoke to patient.  She switched pharmacies and uses [facility] now.  She is due for appointment so we scheduled her for an appointment with [facility] in October and send refills to get to that appt

## 2024-01-13 NOTE — Telephone Encounter (Signed)
 Patient is requesting that we send over a refill of her Omeprazole  to [facility] on [address] Please advise.

## 2024-01-13 NOTE — Assessment & Plan Note (Signed)
 Chronic, well controlled.  Manages with amlodipine  5 mg, lisinopril  20 mg, hydrochlorothiazide  12.5 mg, spironolactone  25 mg, metoprolol .  Also follows with cardiology . Refilled as appropriate  Ordering bmp F/b 6 mo

## 2024-01-13 NOTE — Assessment & Plan Note (Signed)
 Refilled levothyroxine  50 mcg, repeat tsh

## 2024-01-13 NOTE — Progress Notes (Signed)
 Established patient visit   Patient: Janet Phelps   DOB: 1955/06/22   68 y.o. Female  MRN: [id number] Visit Date: 01/13/2024  Today's healthcare provider: Manuelita Flatness, PA-C   Chief Complaint  Patient presents with   leg cramps    And some swelling   Subjective     Discussed the use of AI scribe software for clinical note transcription with the patient, who gave verbal consent to proceed.  History of Present Illness   Janet Phelps is a 68 year old female with hypertension who presents with swelling and cramping in her legs.  She experiences leg swelling and cramping, particularly after prolonged walking or sitting. 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 Cramping is severe and affects her toes. Swelling subsides when she rests her legs after work.  She is consistent with her potassium supplements and takes an otc magnesium supplement.  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 She is considering retirement.   Medications: Outpatient Medications Prior to Visit  Medication Sig   Ascorbic Acid (VITAMIN C PO) Take 1 tablet by mouth daily. (Patient taking differently: Take 1 tablet by mouth as needed.)   aspirin 81 MG tablet Take 81 mg by mouth 2 (two) times a week.   atovaquone -proguanil (MALARONE ) 250-100 MG TABS tablet Take 1 tablet by mouth daily.   B Complex Vitamins (VITAMIN-B COMPLEX PO) Take 1 tablet by mouth daily.   ferrous sulfate  (FEROSUL) 325 (65 FE) MG tablet Take 1 tablet (325 mg total) by mouth every Monday, Wednesday, and Friday.   mefloquine  (LARIAM ) 250 MG tablet Take 1 tablet (250 mg total) by mouth once a week.   mefloquine  (LARIAM ) 250 MG tablet Take 1 tablet (250 mg total) by mouth once a week.   omeprazole  (PRILOSEC) 20 MG capsule Take 1 capsule (20 mg total) by mouth daily.   Potassium  Chloride ER 20 MEQ TBCR Take 1 tablet (20 mEq total) by mouth 3 (three) times daily.   spironolactone  (ALDACTONE ) 25 MG tablet Take 1 tablet (25 mg total) by mouth daily.   [DISCONTINUED] amLODipine  (NORVASC ) 5 MG tablet Take 1 tablet (5 mg total) by mouth daily.   [DISCONTINUED] amLODipine -benazepril  (LOTREL ) 5-20 MG capsule Take 1 capsule by mouth daily.   [DISCONTINUED] amLODipine -olmesartan  (AZOR ) 5-20 MG tablet Take 1 tablet by mouth daily.   [DISCONTINUED] ciprofloxacin  (CIPRO ) 250 MG tablet Take 1 tablet (250 mg total) by mouth every 12 (twelve) hours as needed for 10 days (Patient taking differently: Take 250 mg by mouth every 12 (twelve) hours as needed (Infection).)   [DISCONTINUED] hydrochlorothiazide  (HYDRODIURIL ) 12.5 MG tablet Take 1 tablet (12.5 mg total) by mouth daily.   [DISCONTINUED] levothyroxine  (SYNTHROID ) 50 MCG tablet Take 1 tablet (50 mcg total) by mouth daily.   [DISCONTINUED] lisinopril  (ZESTRIL ) 20 MG tablet Take 1 tablet (20 mg total) by mouth daily.   [DISCONTINUED] metoprolol  succinate (TOPROL -XL) 50 MG 24 hr tablet Take 1 tablet (50 mg total) by mouth daily. Take with or immediately following a meal.   [DISCONTINUED] metroNIDAZOLE  (FLAGYL ) 500 MG tablet Take 1 tablet (500 mg total) by mouth 3 (three) times daily as needed for 10 days (Patient taking differently: Take 500 mg by mouth 3 (three) times daily as needed (Yeast).)   No facility-administered medications prior to visit.    Review of Systems  Constitutional:  Negative  for fatigue and fever.  Respiratory:  Negative for cough and shortness of breath.   Cardiovascular:  Positive for leg swelling. Negative for chest pain.  Gastrointestinal:  Negative for abdominal pain.  Musculoskeletal:  Positive for myalgias.  Neurological:  Negative for dizziness and headaches.       Objective    BP 114/72   Pulse 71   Ht 5' 5 (1.651 m)   Wt 215 lb 3.2 oz (97.6 kg)   BMI 35.81 kg/m    Physical  Exam Constitutional:      General: She is awake.     Appearance: She is well-developed.  HENT:     Head: Normocephalic.  Eyes:     Conjunctiva/sclera: Conjunctivae normal.  Cardiovascular:     Rate and Rhythm: Normal rate and regular rhythm.     Heart sounds: Normal heart sounds.  Pulmonary:     Effort: Pulmonary effort is normal.     Breath sounds: Normal breath sounds.  Musculoskeletal:     Right lower leg: No edema.     Left lower leg: No edema.  Skin:    General: Skin is warm.  Neurological:     Mental Status: She is alert and oriented to person, place, and time.  Psychiatric:        Attention and Perception: Attention normal.        Mood and Affect: Mood normal.        Speech: Speech normal.        Behavior: Behavior is cooperative.      No results found for any visits on 01/13/24.  Assessment & Plan    Primary hypertension Assessment & Plan: Chronic, well controlled.  Manages with amlodipine  5 mg, lisinopril  20 mg, hydrochlorothiazide  12.5 mg, spironolactone  25 mg, metoprolol .  Also follows with cardiology . Refilled as appropriate  Ordering bmp F/b 6 mo  Orders: -     Magnesium -     Basic metabolic panel with GFR -     amLODIPine  Besylate; Take 1 tablet (5 mg total) by mouth daily.  Dispense: 90 tablet; Refill: 1 -     hydroCHLOROthiazide ; Take 1 tablet (12.5 mg total) by mouth daily.  Dispense: 90 tablet; Refill: 1 -     Lisinopril ; Take 1 tablet (20 mg total) by mouth daily.  Dispense: 90 tablet; Refill: 1 -     CBC with Differential/Platelet -     Metoprolol  Succinate ER; Take 1 tablet (50 mg total) by mouth daily. Take with or immediately following a meal.  Dispense: 90 tablet; Refill: 2  Hypothyroidism, unspecified type Assessment & Plan: Refilled levothyroxine  50 mcg, repeat tsh  Orders: -     Levothyroxine  Sodium; Take 1 tablet (50 mcg total) by mouth daily.  Dispense: 90 tablet; Refill: 1 -     TSH -     CBC with  Differential/Platelet  Localized edema -     Brain natriuretic peptide    Return in about 6 months (around 07/15/2024), or if symptoms worsen or fail to improve, for hypertension.       Manuelita Flatness, PA-C  [facility] [phone] (phone) [phone] (fax)  [facility]

## 2024-01-14 ENCOUNTER — Ambulatory Visit: Payer: Self-pay | Admitting: Physician Assistant

## 2024-01-17 NOTE — Telephone Encounter (Signed)
 No A1C done, but looks like glucose is fine - 84. Okay to tell her that?

## 2024-01-19 ENCOUNTER — Ambulatory Visit

## 2024-01-19 ENCOUNTER — Other Ambulatory Visit: Payer: Self-pay | Admitting: *Deleted

## 2024-01-19 DIAGNOSIS — R7301 Impaired fasting glucose: Secondary | ICD-10-CM

## 2024-01-19 LAB — HEMOGLOBIN A1C: Hgb A1c MFr Bld: 6.1 % (ref 4.6–6.5)

## 2024-01-20 ENCOUNTER — Ambulatory Visit: Payer: Self-pay | Admitting: Physician Assistant

## 2024-01-24 ENCOUNTER — Other Ambulatory Visit: Payer: Self-pay | Admitting: *Deleted

## 2024-01-24 MED ORDER — SPIRONOLACTONE 25 MG PO TABS: 25.0000 mg | ORAL_TABLET | Freq: Every day | ORAL | 1 refills | Status: AC

## 2024-03-24 ENCOUNTER — Other Ambulatory Visit: Payer: Self-pay | Admitting: Gastroenterology

## 2024-03-24 DIAGNOSIS — K319 Disease of stomach and duodenum, unspecified: Secondary | ICD-10-CM

## 2024-03-24 DIAGNOSIS — R1011 Right upper quadrant pain: Secondary | ICD-10-CM

## 2024-03-24 DIAGNOSIS — K219 Gastro-esophageal reflux disease without esophagitis: Secondary | ICD-10-CM

## 2024-04-07 ENCOUNTER — Encounter: Payer: Self-pay | Admitting: Gastroenterology

## 2024-04-07 ENCOUNTER — Ambulatory Visit: Admitting: Gastroenterology

## 2024-04-07 VITALS — BP 130/74 | HR 63 | Ht 65.0 in | Wt 219.2 lb

## 2024-04-07 DIAGNOSIS — Z79899 Other long term (current) drug therapy: Secondary | ICD-10-CM

## 2024-04-07 DIAGNOSIS — K219 Gastro-esophageal reflux disease without esophagitis: Secondary | ICD-10-CM

## 2024-04-07 DIAGNOSIS — R1310 Dysphagia, unspecified: Secondary | ICD-10-CM | POA: Diagnosis not present

## 2024-04-07 DIAGNOSIS — R109 Unspecified abdominal pain: Secondary | ICD-10-CM

## 2024-04-07 NOTE — Progress Notes (Signed)
 HPI :  68 year old female here for follow-up visit for GERD, dysphagia, abdominal pain.  She was having some upper abdominal discomfort, GERD, dysphagia last year.  In September 2024 EGD showed no stricture or stenosis, no Barrett's, no hiatal hernia.  She did have H. pylori gastritis and was treated for that, follow-up stool testing with eradication.  She was on omeprazole  at the time which resolved her reflux and upper abdominal discomfort and we decided to continue that as needed.  Kidney function is normal.  She states she had a DEXA scan in 2023 which was normal.  She states she uses omeprazole  most days, dosed at 20 mg.  On this regimen she has no breakthrough reflux and it works really well.  She states she can have recurrent symptoms if she misses a few days of the medication.  Over the past 8 months she states she has had some dysphagia that has bothered her periodically, based on what she is eating.  Specifically if she eats hamburgers, she can feel it gets stuck at the bottom of her chest and drinks water to push it through.  This typically occurs with bulkier foods but is not too frequent for her.  We discussed options to evaluate this.  She also has questions about upper abdominal discomfort in her epigastric to right upper area.  This has been going on for a few years now.  She states whenever she wears tight pants with a belt it puts pressure on the area and she feels a soreness there.  She denies any prandial association with her symptoms.  If she is not wearing tight pants or tight belt she will not feel this discomfort.  She inquires about her last liver enzymes which were normal back in February.  She had a CT scan of her abdomen and pelvis in 2018 which showed a normal pancreas.  She inquires if this could be her pancreas, she has no family history of pancreatic cancer that is known although she states she has a limited family history.   EGD 02/17/2023 - Esophagogastric landmarks  were identified: the Z-line was found at 38 cm, the gastroesophageal junction was found at 38 cm and the upper extent of the gastric folds was found at 38 cm from the incisors. Findings: - The exam of the esophagus was otherwise normal. No focal stenosis / stricture noted. - Patchy mildly erythematous mucosa was found in the gastric antrum. - The exam of the stomach was otherwise normal. - Biopsies were taken with a cold forceps for Helicobacter pylori testing. - The examined duodenum was normal.   Colonoscopy 02/17/2023: - The perianal and digital rectal examinations were normal. - The terminal ileum appeared normal. - A 4 mm polyp was found in the ascending colon. The polyp was sessile. The polyp was removed with a cold snare. Resection and retrieval were complete. - The colon was long and redundant. - Internal hemorrhoids were found during retroflexion. - The exam was otherwise without abnormality.  FINAL DIAGNOSIS        1. Surgical [P], gastric antrum and gastric body :       - H.PYLORI GASTRITIS       - POSITIVE FOR H. PYLORI ON IMMUNOHISTOCHEMICAL STAIN       - NEGATIVE FOR INTESTINAL METAPLASIA, DYSPLASIA OR MALIGNANCY        2. Surgical [P], colon, ascending, polyp (1) :       - TUBULAR ADENOMA.       -  NO HIGH GRADE DYSPLASIA OR MALIGNANCY.    October 2024 - negative H pylori stool test    Past Medical History:  Diagnosis Date   Abnormal blood chemistry 07/18/2013   Abnormal urine 01/04/2017   Anemia 05/09/2018   Chronic RLQ pain 01/04/2017   Costovertebral angle tenderness 01/04/2017   Diabetes mellitus (HCC)    History of gestational diabetes 05/09/2012   GDM 1 pregnancy on  insulin   History of Helicobacter pylori infection 06/25/2017   HTN (hypertension)    Hypercholesterolemia 07/18/2013   Hypokalemia 02/28/2014   Insomnia 07/18/2013   Mastodynia 06/06/2013   Nausea 06/04/2015   Newly recognized heart murmur 12/22/2013   OSA on CPAP    Other benign mammary  dysplasias of right breast 01/14/2015   Right flank pain 01/04/2017   Sciatica    Screening for colon cancer 01/14/2015   Seasonal allergies 06/04/2015   Sensation of lump in throat 06/04/2015   Severe obesity (BMI 35.0-39.9) with comorbidity (HCC) 06/25/2017   Snoring 12/23/2016   Vaginal pain 01/14/2015   Vitamin D deficiency 07/23/2017     Past Surgical History:  Procedure Laterality Date   CESAREAN SECTION     x3   CHOLECYSTECTOMY     COLPOSCOPY     LUMBAR LAMINECTOMY     TONSILLECTOMY     Family History  Problem Relation Age of Onset   Hypertension Mother    Stroke Mother    Thyroid  disease Mother    Hypertension Sister    Hypertension Sister    Hypertension Sister    Colon cancer Neg Hx    Esophageal cancer Neg Hx    Rectal cancer Neg Hx    Stomach cancer Neg Hx    Social History   Tobacco Use   Smoking status: Never    Passive exposure: Never   Smokeless tobacco: Never  Vaping Use   Vaping status: Never Used  Substance Use Topics   Alcohol use: Yes    Comment: OCC   Drug use: No   Current Outpatient Medications  Medication Sig Dispense Refill   amLODipine  (NORVASC ) 5 MG tablet Take 1 tablet (5 mg total) by mouth daily. 90 tablet 1   Ascorbic Acid (VITAMIN C PO) Take 1 tablet by mouth daily.     aspirin 81 MG tablet Take 81 mg by mouth 2 (two) times a week.     atovaquone -proguanil (MALARONE ) 250-100 MG TABS tablet Take 1 tablet by mouth daily. 10 tablet 0   B Complex Vitamins (VITAMIN-B COMPLEX PO) Take 1 tablet by mouth daily.     ferrous sulfate  (FEROSUL) 325 (65 FE) MG tablet Take 1 tablet (325 mg total) by mouth every Monday, Wednesday, and Friday. 100 tablet 0   hydrochlorothiazide  (HYDRODIURIL ) 12.5 MG tablet Take 1 tablet (12.5 mg total) by mouth daily. 90 tablet 1   levothyroxine  (SYNTHROID ) 50 MCG tablet Take 1 tablet (50 mcg total) by mouth daily. 90 tablet 1   lisinopril  (ZESTRIL ) 20 MG tablet Take 1 tablet (20 mg total) by mouth daily. 90  tablet 1   mefloquine  (LARIAM ) 250 MG tablet Take 1 tablet (250 mg total) by mouth once a week. 4 tablet 0   mefloquine  (LARIAM ) 250 MG tablet Take 1 tablet (250 mg total) by mouth once a week. 4 tablet 0   metoprolol  succinate (TOPROL -XL) 50 MG 24 hr tablet Take 1 tablet (50 mg total) by mouth daily. Take with or immediately following a meal. 90 tablet 2  omeprazole  (PRILOSEC) 20 MG capsule TAKE 1 CAPSULE (20 MG TOTAL) BY MOUTH DAILY. PLEASE KEEP YOUR APPOINTMENT IN OCTOBER WITH DR. LEIGH FOR FURTHER REFILLS. THANK YOU 90 capsule 0   Potassium Chloride  ER 20 MEQ TBCR Take 1 tablet (20 mEq total) by mouth 3 (three) times daily. (Patient taking differently: Take 20 mEq by mouth 2 (two) times daily.) 270 tablet 1   spironolactone  (ALDACTONE ) 25 MG tablet Take 1 tablet (25 mg total) by mouth daily. 90 tablet 1   No current facility-administered medications for this visit.   Allergies  Allergen Reactions   Labetalol Itching    Scalp itching, hard to breathe, feel stuffy Scalp itching, hard to breathe, feel stuffy    Benazepril  Cough     Review of Systems: All systems reviewed and negative except where noted in HPI.   Lab Results  Component Value Date   WBC 4.7 01/13/2024   HGB 12.5 01/13/2024   HCT 37.6 01/13/2024   MCV 96.6 01/13/2024   PLT 182.0 01/13/2024    Lab Results  Component Value Date   ALT 12 08/04/2023   AST 16 08/04/2023   ALKPHOS 52 08/04/2023   BILITOT 0.8 08/04/2023     Physical Exam: BP 130/74   Pulse 63   Ht 5' 5 (1.651 m)   Wt 219 lb 4 oz (99.5 kg)   BMI 36.49 kg/m  Constitutional: Pleasant,well-developed, female in no acute distress. Abdominal: Soft, nondistended, nontender. There are no masses palpable. No hepatomegaly. Neurological: Alert and oriented to person place and time. Psychiatric: Normal mood and affect. Behavior is normal.   ASSESSMENT: 67 y.o. female here for assessment of the following  1. Gastroesophageal reflux disease,  unspecified whether esophagitis present   2. Long-term current use of proton pump inhibitor therapy   3. Dysphagia, unspecified type   4. Abdominal pain, unspecified abdominal location    History as outlined above.  On omeprazole  for reflux which is working quite well to control it.  We discussed long-term use of chronic PPI and risks.  Long-term want to use lowest dose needed to control symptoms.  She would like to taper down dosing as tolerated, will try every other day dosing.  If this works she may inquire about a lower dose such as 10 mg of omeprazole  if possible.  She will keep me posted on this I can change her prescription as needed.  She understands risks of PPI.  I do think she needs some level of antacid as her symptoms have recurred when she stops it.  We discussed her dysphagia.  EGD is reassuring, no concerning process there last year.  We discussed DDx, possible she developed a peptic stricture, dysmotility also possible.  I recommended a barium swallow to further evaluate this.  If it localizes a clear stricture we could do an EGD with dilation to address it.  If it shows more dysmotility causing this we would discuss that.  Following discussion of barium swallow, she is declining that for now, due to insurance issues she may consider doing this next year.  EGD findings were reassuring.  Otherwise in regards to her abdominal pain this seems very consistent with musculoskeletal pain along the site of her cholecystectomy.  Very reliably related to wearing tight pants and a belt.  I reassured her that her liver enzymes are normal.  If she is worried about her pancreas I could offer her a CT scan to evaluate that, she had a negative exam in 2018 in  regards to her pancreas.  Following discussion of this she is declining imaging at this time.  Her exam is unremarkable and reassuring.   PLAN: - continue omeprazole , discussed long term risks / benefits - she will try to taper dosing of  omeprazole  as tolerated, start with every other day - recommended barium swallow with tablet - she declined for now, reassured her of EGD - reassured her of LFTs / symptoms very c/w musculoskeletal pain. She is worried about her pancreas, offered imaging, she declines (negative CT 2018) - f/u yearly if not sooner with issues  Marcey Naval, MD [facility]

## 2024-04-07 NOTE — Patient Instructions (Addendum)
 Continue omeprazole . Taper as tolerated.  Follow up in 1 year.   Thank you for entrusting me with your care and for choosing [facility], Dr. Elspeth Naval   _______________________________________________________  If your blood pressure at your visit was 140/90 or greater, please contact your primary care physician to follow up on this.  _______________________________________________________  If you are age 67 or older, your body mass index should be between 23-30. Your Body mass index is 36.49 kg/m. If this is out of the aforementioned range listed, please consider follow up with your Primary Care Provider.  If you are age 67 or younger, your body mass index should be between 19-25. Your Body mass index is 36.49 kg/m. If this is out of the aformentioned range listed, please consider follow up with your Primary Care Provider.   ________________________________________________________  The [facility] providers would like to encourage you to use MYCHART to communicate with providers for non-urgent requests or questions.  Due to long hold times on the telephone, sending your provider a message by [facility] may be a faster and more efficient way to get a response.  Please allow 48 business hours for a response.  Please remember that this is for non-urgent requests.  _______________________________________________________  [facility] is using a team-based approach to care.  Your team is made up of your doctor and two to three APPS. Our APPS (Nurse Practitioners and Physician Assistants) work with your physician to ensure care continuity for you. They are fully qualified to address your health concerns and develop a treatment plan. They communicate directly with your gastroenterologist to care for you. Seeing the Advanced Practice Practitioners on your physician's team can help you by facilitating care more promptly, often allowing for earlier appointments, access to  diagnostic testing, procedures, and other specialty referrals.

## 2024-05-10 ENCOUNTER — Ambulatory Visit
Admission: RE | Admit: 2024-05-10 | Discharge: 2024-05-10 | Disposition: A | Source: Ambulatory Visit | Attending: Obstetrics and Gynecology

## 2024-05-10 DIAGNOSIS — R921 Mammographic calcification found on diagnostic imaging of breast: Secondary | ICD-10-CM

## 2024-05-16 ENCOUNTER — Telehealth: Payer: Self-pay | Admitting: Family Medicine

## 2024-05-16 ENCOUNTER — Other Ambulatory Visit: Payer: Self-pay | Admitting: Family Medicine

## 2024-05-16 ENCOUNTER — Ambulatory Visit: Attending: Cardiology | Admitting: Cardiology

## 2024-05-16 ENCOUNTER — Encounter: Payer: Self-pay | Admitting: Family Medicine

## 2024-05-16 ENCOUNTER — Other Ambulatory Visit (HOSPITAL_BASED_OUTPATIENT_CLINIC_OR_DEPARTMENT_OTHER): Payer: Self-pay

## 2024-05-16 ENCOUNTER — Encounter: Payer: Self-pay | Admitting: Cardiology

## 2024-05-16 VITALS — BP 148/90 | HR 52 | Ht 65.0 in | Wt 225.0 lb

## 2024-05-16 DIAGNOSIS — I10 Essential (primary) hypertension: Secondary | ICD-10-CM

## 2024-05-16 DIAGNOSIS — I493 Ventricular premature depolarization: Secondary | ICD-10-CM

## 2024-05-16 DIAGNOSIS — E7800 Pure hypercholesterolemia, unspecified: Secondary | ICD-10-CM

## 2024-05-16 DIAGNOSIS — R002 Palpitations: Secondary | ICD-10-CM

## 2024-05-16 DIAGNOSIS — I517 Cardiomegaly: Secondary | ICD-10-CM

## 2024-05-16 MED ORDER — MEFLOQUINE HCL 250 MG PO TABS
250.0000 mg | ORAL_TABLET | ORAL | 0 refills | Status: AC
  Filled 2024-05-16: qty 4, 28d supply, fill #0

## 2024-05-16 MED ORDER — LISINOPRIL 20 MG PO TABS
20.0000 mg | ORAL_TABLET | Freq: Every day | ORAL | 1 refills | Status: AC
  Filled 2024-05-16 – 2024-05-17 (×2): qty 90, 90d supply, fill #0

## 2024-05-16 NOTE — Telephone Encounter (Signed)
 Pt is asking for three medications to be filled before she travels internationally. Pt wants malarone  if lariam  is not available, pt also wants lisinopril  to be called in downstairs. Please let pt know when called in.

## 2024-05-16 NOTE — Patient Instructions (Addendum)
Medication Instructions:  Your physician recommends that you continue on your current medications as directed. Please refer to the Current Medication list given to you today.  *If you need a refill on your cardiac medications before your next appointment, please call your pharmacy*   Lab Work: 3rd Floor   [address]  Your physician recommends that you return for lab work in:  when fasting You need to have labs done when you are fasting.  You can come Monday through Friday 8:00 am to 11:30AM and 1:00 to 4:00. You do not need to make an appointment as the order has already been placed.     Testing/Procedures: None Ordered   Follow-Up: At [facility], you and your health needs are our priority.  As part of our continuing mission to provide you with exceptional heart care, we have created designated Provider Care Teams.  These Care Teams include your primary Cardiologist (physician) and Advanced Practice Providers (APPs -  Physician Assistants and Nurse Practitioners) who all work together to provide you with the care you need, when you need it.  We recommend signing up for the patient portal called "MyChart".  Sign up information is provided on this After Visit Summary.  MyChart is used to connect with patients for Virtual Visits (Telemedicine).  Patients are able to view lab/test results, encounter notes, upcoming appointments, etc.  Non-urgent messages can be sent to your provider as well.   To learn more about what you can do with MyChart, go to [email or web address].    Your next appointment:   6 month(s)  The format for your next appointment:   In Person  Provider:   Gypsy Balsam, MD    Other Instructions NA

## 2024-05-16 NOTE — Progress Notes (Unsigned)
 Cardiology Office Note:    Date:  05/16/2024   ID:  Janet Phelps [birth year], MRN [id number]  PCP:  Cyndi Shaver, PA-C (Inactive)  Cardiologist:  Lamar Fitch, MD    Referring MD: No ref. provider found   Chief Complaint  Patient presents with   Follow-up    History of Present Illness:    Janet Phelps is a 68 y.o. female past medical history significant for essential hypertension, palpitation, anemia, diabetes, dyslipidemia, PVCs.  She comes today to months for follow-up next week she goes to [place] for about 3 weeks.  Overall she is doing fine she said palpitations do not bother her and overall she is doing well.  She does have elevation of calcium score, however stress test was negative this last year.  Denies have any signs and symptoms of coronary artery disease.  Past Medical History:  Diagnosis Date   Abnormal blood chemistry 07/18/2013   Abnormal urine 01/04/2017   Anemia 05/09/2018   Chronic RLQ pain 01/04/2017   Costovertebral angle tenderness 01/04/2017   Diabetes mellitus (HCC)    History of gestational diabetes 05/09/2012   GDM 1 pregnancy on  insulin   History of Helicobacter pylori infection 06/25/2017   HTN (hypertension)    Hypercholesterolemia 07/18/2013   Hypokalemia 02/28/2014   Insomnia 07/18/2013   Mastodynia 06/06/2013   Nausea 06/04/2015   Newly recognized heart murmur 12/22/2013   OSA on CPAP    Other benign mammary dysplasias of right breast 01/14/2015   Right flank pain 01/04/2017   Sciatica    Screening for colon cancer 01/14/2015   Seasonal allergies 06/04/2015   Sensation of lump in throat 06/04/2015   Severe obesity (BMI 35.0-39.9) with comorbidity (HCC) 06/25/2017   Snoring 12/23/2016   Vaginal pain 01/14/2015   Vitamin D deficiency 07/23/2017    Past Surgical History:  Procedure Laterality Date   CESAREAN SECTION     x3   CHOLECYSTECTOMY     COLPOSCOPY     LUMBAR LAMINECTOMY     TONSILLECTOMY       Current Medications: Current Meds  Medication Sig   amLODipine  (NORVASC ) 5 MG tablet Take 1 tablet (5 mg total) by mouth daily.   Ascorbic Acid (VITAMIN C PO) Take 1 tablet by mouth daily.   aspirin 81 MG tablet Take 81 mg by mouth 2 (two) times a week.   atovaquone -proguanil (MALARONE ) 250-100 MG TABS tablet Take 1 tablet by mouth daily.   B Complex Vitamins (VITAMIN-B COMPLEX PO) Take 1 tablet by mouth daily.   ferrous sulfate  (FEROSUL) 325 (65 FE) MG tablet Take 1 tablet (325 mg total) by mouth every Monday, Wednesday, and Friday.   hydrochlorothiazide  (HYDRODIURIL ) 12.5 MG tablet Take 1 tablet (12.5 mg total) by mouth daily.   levothyroxine  (SYNTHROID ) 50 MCG tablet Take 1 tablet (50 mcg total) by mouth daily.   lisinopril  (ZESTRIL ) 20 MG tablet Take 1 tablet (20 mg total) by mouth daily.   mefloquine  (LARIAM ) 250 MG tablet Take 1 tablet (250 mg total) by mouth once a week.   mefloquine  (LARIAM ) 250 MG tablet Take 1 tablet (250 mg total) by mouth once a week.   metoprolol  succinate (TOPROL -XL) 50 MG 24 hr tablet Take 1 tablet (50 mg total) by mouth daily. Take with or immediately following a meal.   omeprazole  (PRILOSEC) 20 MG capsule TAKE 1 CAPSULE (20 MG TOTAL) BY MOUTH DAILY. PLEASE KEEP YOUR APPOINTMENT IN OCTOBER WITH DR. LEIGH FOR FURTHER REFILLS. THANK YOU  Potassium Chloride  ER 20 MEQ TBCR Take 1 tablet (20 mEq total) by mouth 3 (three) times daily. (Patient taking differently: Take 20 mEq by mouth 2 (two) times daily.)   spironolactone  (ALDACTONE ) 25 MG tablet Take 1 tablet (25 mg total) by mouth daily.     Allergies:   Labetalol and Benazepril    Social History   Socioeconomic History   Marital status: Single    Spouse name: Not on file   Number of children: 4   Years of education: Not on file   Highest education level: Not on file  Occupational History   Not on file  Tobacco Use   Smoking status: Never    Passive exposure: Never   Smokeless tobacco: Never   Vaping Use   Vaping status: Never Used  Substance and Sexual Activity   Alcohol use: Yes    Comment: OCC   Drug use: No   Sexual activity: Never  Other Topics Concern   Not on file  Social History Narrative   Not on file   Social Drivers of Health   Financial Resource Strain: Low Risk (07/01/2022)   Received [facility]   Overall Financial Resource Strain (CARDIA)    Difficulty of Paying Living Expenses: Not hard at all  Food Insecurity: No Food Insecurity (07/01/2022)   Received from [facility]   Hunger Vital Sign    Within the past 12 months, you worried that your food would run out before you got the money to buy more.: Never true    Within the past 12 months, the food you bought just didn't last and you didn't have money to get more.: Never true  Transportation Needs: No Transportation Needs (07/01/2022)   Received from [facility] - Transportation    Lack of Transportation (Medical): No    Lack of Transportation (Non-Medical): No  Physical Activity: Insufficiently Active (09/24/2021)   Received from [facility]   Exercise Vital Sign    On average, how many days per week do you engage in moderate to strenuous exercise (like a brisk walk)?: 1 day    On average, how many minutes do you engage in exercise at this level?: 20 min  Stress: No Stress Concern Present (09/24/2021)   Received from [facility] - Occupational Stress Questionnaire    Feeling of Stress : Not at all  Social Connections: Unknown (09/26/2022)   Received from [facility]   Social Network    Social Network: Not on file     Family History: The patient's family history includes Hypertension in her mother, sister, sister, and sister; Stroke in her mother; Thyroid  disease in her mother. There is no history of Colon cancer, Esophageal cancer, Rectal cancer, or Stomach cancer. ROS:   Please see the history of present illness.    All 14 point  review of systems negative except as described per history of present illness  EKGs/Labs/Other Studies Reviewed:    EKG Interpretation Date/Time:  Tuesday May 16 2024 13:21:27 EST Ventricular Rate:  52 PR Interval:  154 QRS Duration:  102 QT Interval:  434 QTC Calculation: 403 R Axis:   42  Text Interpretation: Sinus bradycardia with sinus arrhythmia Nonspecific T wave abnormality When compared with ECG of 17-Oct-2022 20:23, PREVIOUS ECG IS PRESENT Confirmed by Bernie Charleston [phone]) on 05/16/2024 1:22:16 PM    Recent Labs: 08/04/2023: ALT 12 01/13/2024: BUN 15; Creatinine, Ser 1.02; Hemoglobin 12.5; Magnesium 1.8; Platelets  182.0; Potassium 4.4; Pro B Natriuretic peptide (BNP) 16.0; Sodium 141; TSH 3.60  Recent Lipid Panel    Component Value Date/Time   CHOL 187 08/04/2023 1417   TRIG 127.0 08/04/2023 1417   HDL 51.20 08/04/2023 1417   CHOLHDL 4 08/04/2023 1417   VLDL 25.4 08/04/2023 1417   LDLCALC 110 (H) 08/04/2023 1417    Physical Exam:    VS:  BP (!) 148/90   Pulse (!) 52   Ht 5' 5 (1.651 m)   Wt 225 lb (102.1 kg)   SpO2 98%   BMI 37.44 kg/m     Wt Readings from Last 3 Encounters:  05/16/24 225 lb (102.1 kg)  04/07/24 219 lb 4 oz (99.5 kg)  01/13/24 215 lb 3.2 oz (97.6 kg)     GEN:  Well nourished, well developed in no acute distress HEENT: Normal NECK: No JVD; No carotid bruits LYMPHATICS: No lymphadenopathy CARDIAC: RRR, no murmurs, no rubs, no gallops RESPIRATORY:  Clear to auscultation without rales, wheezing or rhonchi  ABDOMEN: Soft, non-tender, non-distended MUSCULOSKELETAL:  No edema; No deformity  SKIN: Warm and dry LOWER EXTREMITIES: no swelling NEUROLOGIC:  Alert and oriented x 3 PSYCHIATRIC:  Normal affect   ASSESSMENT:    1. Primary hypertension   2. PVC's (premature ventricular contractions)   3. Left ventricular hypertrophy   4. Palpitations    PLAN:    In order of problems listed above:  Palpitations.  Seems to be under  control continue present complex management. Essential hypertension blood pressure slightly elevated but she said she check it at home always good. Elevated calcium score asymptomatic, will continue antiplatelet therapy. Dyslipidemia not controlled well LDL at 110 HDL 51 I would like to see LDL less than 70, she wants to recheck her cholesterol after she comes back from [place] and hopefully at that time we will be able to initiate some therapy   Medication Adjustments/Labs and Tests Ordered: Current medicines are reviewed at length with the patient today.  Concerns regarding medicines are outlined above.  Orders Placed This Encounter  Procedures   EKG 12-Lead   Medication changes: No orders of the defined types were placed in this encounter.   Signed, Lamar DOROTHA Fitch, MD, [facility] 05/16/2024 1:46 PM    [facility]

## 2024-05-17 ENCOUNTER — Other Ambulatory Visit (HOSPITAL_BASED_OUTPATIENT_CLINIC_OR_DEPARTMENT_OTHER): Payer: Self-pay

## 2024-06-20 ENCOUNTER — Other Ambulatory Visit: Payer: Self-pay | Admitting: Gastroenterology

## 2024-06-20 ENCOUNTER — Other Ambulatory Visit (HOSPITAL_BASED_OUTPATIENT_CLINIC_OR_DEPARTMENT_OTHER): Payer: Self-pay

## 2024-06-20 DIAGNOSIS — K319 Disease of stomach and duodenum, unspecified: Secondary | ICD-10-CM

## 2024-06-20 DIAGNOSIS — K219 Gastro-esophageal reflux disease without esophagitis: Secondary | ICD-10-CM

## 2024-06-20 DIAGNOSIS — R1011 Right upper quadrant pain: Secondary | ICD-10-CM

## 2024-06-20 MED ORDER — METOPROLOL SUCCINATE ER 50 MG PO TB24
50.0000 mg | ORAL_TABLET | Freq: Every day | ORAL | 2 refills | Status: AC
  Filled 2024-07-11: qty 90, 90d supply, fill #0

## 2024-06-29 ENCOUNTER — Telehealth: Payer: Self-pay

## 2024-06-29 ENCOUNTER — Encounter: Payer: Self-pay | Admitting: Cardiology

## 2024-06-29 DIAGNOSIS — E7800 Pure hypercholesterolemia, unspecified: Secondary | ICD-10-CM

## 2024-06-29 LAB — LIPID PANEL
Chol/HDL Ratio: 3.8 ratio (ref 0.0–4.4)
Cholesterol, Total: 223 mg/dL — ABNORMAL HIGH (ref 100–199)
HDL: 59 mg/dL
LDL Chol Calc (NIH): 139 mg/dL — ABNORMAL HIGH (ref 0–99)
Triglycerides: 143 mg/dL (ref 0–149)
VLDL Cholesterol Cal: 25 mg/dL (ref 5–40)

## 2024-06-29 LAB — ALT: ALT: 17 IU/L (ref 0–32)

## 2024-06-29 LAB — AST: AST: 25 IU/L (ref 0–40)

## 2024-06-29 MED ORDER — ROSUVASTATIN CALCIUM 10 MG PO TABS: 10.0000 mg | ORAL_TABLET | Freq: Every day | ORAL | 3 refills | Status: DC

## 2024-06-29 NOTE — Telephone Encounter (Signed)
"   Crestor  10 mg daily, fasting lipid profile, AST LT 6 weeks  "

## 2024-07-04 ENCOUNTER — Ambulatory Visit: Payer: Self-pay | Admitting: Cardiology

## 2024-07-06 ENCOUNTER — Other Ambulatory Visit (HOSPITAL_BASED_OUTPATIENT_CLINIC_OR_DEPARTMENT_OTHER): Payer: Self-pay

## 2024-07-06 MED ORDER — ROSUVASTATIN CALCIUM 10 MG PO TABS
10.0000 mg | ORAL_TABLET | Freq: Every day | ORAL | 2 refills | Status: AC
  Filled 2024-07-06: qty 90, 90d supply, fill #0

## 2024-07-06 NOTE — Addendum Note (Signed)
 Addended by: ONEITA BERLINER on: 07/06/2024 08:53 AM   Modules accepted: Orders

## 2024-07-11 ENCOUNTER — Other Ambulatory Visit (HOSPITAL_BASED_OUTPATIENT_CLINIC_OR_DEPARTMENT_OTHER): Payer: Self-pay

## 2024-07-11 ENCOUNTER — Other Ambulatory Visit: Payer: Self-pay

## 2024-10-05 ENCOUNTER — Encounter: Admitting: Family Medicine
